# Patient Record
Sex: Female | Born: 1955 | Race: White | Hispanic: No | Marital: Single | Smoking: Former smoker
Health system: Southern US, Community
[De-identification: ages and names within clinical notes are randomized; demographics above are authoritative.]

## PROBLEM LIST (undated history)

## (undated) DIAGNOSIS — M797 Fibromyalgia: Secondary | ICD-10-CM

## (undated) DIAGNOSIS — C9100 Acute lymphoblastic leukemia not having achieved remission: Secondary | ICD-10-CM

## (undated) DIAGNOSIS — K900 Celiac disease: Secondary | ICD-10-CM

## (undated) DIAGNOSIS — C50212 Malignant neoplasm of upper-inner quadrant of left female breast: Secondary | ICD-10-CM

## (undated) DIAGNOSIS — K589 Irritable bowel syndrome without diarrhea: Secondary | ICD-10-CM

## (undated) DIAGNOSIS — Z923 Personal history of irradiation: Secondary | ICD-10-CM

## (undated) HISTORY — DX: Malignant neoplasm of upper-inner quadrant of left female breast: C50.212

## (undated) HISTORY — DX: Irritable bowel syndrome, unspecified: K58.9

## (undated) HISTORY — PX: BREAST BIOPSY: SHX20

## (undated) HISTORY — PX: BREAST LUMPECTOMY: SHX2

## (undated) HISTORY — DX: Fibromyalgia: M79.7

## (undated) HISTORY — DX: Acute lymphoblastic leukemia not having achieved remission: C91.00

## (undated) HISTORY — PX: ABDOMINAL HYSTERECTOMY: SHX81

## (undated) HISTORY — PX: PARTIAL HYSTERECTOMY: SHX80

---

## 1980-11-03 DIAGNOSIS — C9100 Acute lymphoblastic leukemia not having achieved remission: Secondary | ICD-10-CM

## 1980-11-03 HISTORY — PX: BRAIN SURGERY: SHX531

## 1980-11-03 HISTORY — DX: Acute lymphoblastic leukemia not having achieved remission: C91.00

## 2015-07-16 ENCOUNTER — Encounter: Payer: Self-pay | Admitting: Gastroenterology

## 2015-08-02 ENCOUNTER — Other Ambulatory Visit: Payer: Self-pay | Admitting: Gastroenterology

## 2015-08-02 DIAGNOSIS — R1032 Left lower quadrant pain: Secondary | ICD-10-CM

## 2015-08-10 ENCOUNTER — Ambulatory Visit
Admission: RE | Admit: 2015-08-10 | Discharge: 2015-08-10 | Disposition: A | Discharge status: Home / Self Care | Payer: 59 | Source: Ambulatory Visit | Attending: Gastroenterology | Admitting: Gastroenterology

## 2015-08-10 DIAGNOSIS — R1032 Left lower quadrant pain: Secondary | ICD-10-CM

## 2015-08-10 MED ORDER — IOPAMIDOL (ISOVUE-300) INJECTION 61%
100.0000 mL | Freq: Once | INTRAVENOUS | Status: AC | PRN
  Administered 2015-08-10: 100 mL via INTRAVENOUS

## 2015-09-05 ENCOUNTER — Ambulatory Visit: Payer: Self-pay | Admitting: Gastroenterology

## 2016-04-08 ENCOUNTER — Other Ambulatory Visit: Payer: Self-pay | Admitting: Internal Medicine

## 2016-04-08 DIAGNOSIS — Z1231 Encounter for screening mammogram for malignant neoplasm of breast: Secondary | ICD-10-CM

## 2016-04-16 ENCOUNTER — Ambulatory Visit
Admission: RE | Admit: 2016-04-16 | Discharge: 2016-04-16 | Disposition: A | Discharge status: Home / Self Care | Payer: 59 | Source: Ambulatory Visit | Attending: Internal Medicine | Admitting: Internal Medicine

## 2016-04-16 DIAGNOSIS — Z1231 Encounter for screening mammogram for malignant neoplasm of breast: Secondary | ICD-10-CM

## 2016-04-17 ENCOUNTER — Ambulatory Visit
Admission: RE | Admit: 2016-04-17 | Discharge: 2016-04-17 | Disposition: A | Discharge status: Home / Self Care | Payer: 59 | Source: Ambulatory Visit | Attending: Internal Medicine | Admitting: Internal Medicine

## 2016-04-17 ENCOUNTER — Other Ambulatory Visit: Payer: Self-pay | Admitting: Internal Medicine

## 2016-04-17 DIAGNOSIS — R928 Other abnormal and inconclusive findings on diagnostic imaging of breast: Secondary | ICD-10-CM

## 2016-04-17 DIAGNOSIS — N6320 Unspecified lump in the left breast, unspecified quadrant: Secondary | ICD-10-CM

## 2016-04-21 ENCOUNTER — Other Ambulatory Visit: Payer: Self-pay | Admitting: Internal Medicine

## 2016-04-21 ENCOUNTER — Ambulatory Visit
Admission: RE | Admit: 2016-04-21 | Discharge: 2016-04-21 | Disposition: A | Discharge status: Home / Self Care | Payer: 59 | Source: Ambulatory Visit | Attending: Internal Medicine | Admitting: Internal Medicine

## 2016-04-21 DIAGNOSIS — N6320 Unspecified lump in the left breast, unspecified quadrant: Secondary | ICD-10-CM

## 2016-04-22 ENCOUNTER — Telehealth: Payer: Self-pay | Admitting: *Deleted

## 2016-04-22 NOTE — Telephone Encounter (Signed)
Received referral from Dr. Dalbert Batman Scheduled and confirmed new pt appt with Dr. [name] on 04/25/16 at 3:30/4pm Gave contact information for questions or needs.

## 2016-04-24 ENCOUNTER — Other Ambulatory Visit: Payer: Self-pay | Admitting: General Surgery

## 2016-04-24 DIAGNOSIS — C50112 Malignant neoplasm of central portion of left female breast: Secondary | ICD-10-CM

## 2016-04-25 ENCOUNTER — Ambulatory Visit (HOSPITAL_BASED_OUTPATIENT_CLINIC_OR_DEPARTMENT_OTHER): Payer: 59 | Admitting: Oncology

## 2016-04-25 ENCOUNTER — Other Ambulatory Visit: Payer: Self-pay | Admitting: *Deleted

## 2016-04-25 ENCOUNTER — Other Ambulatory Visit (HOSPITAL_BASED_OUTPATIENT_CLINIC_OR_DEPARTMENT_OTHER): Payer: 59

## 2016-04-25 ENCOUNTER — Encounter: Payer: Self-pay | Admitting: *Deleted

## 2016-04-25 VITALS — BP 121/79 | HR 89 | Temp 98.6°F | Resp 18 | Wt 131.6 lb

## 2016-04-25 DIAGNOSIS — Z856 Personal history of leukemia: Secondary | ICD-10-CM

## 2016-04-25 DIAGNOSIS — C50212 Malignant neoplasm of upper-inner quadrant of left female breast: Secondary | ICD-10-CM | POA: Diagnosis not present

## 2016-04-25 DIAGNOSIS — Z170 Estrogen receptor positive status [ER+]: Secondary | ICD-10-CM

## 2016-04-25 LAB — CBC WITH DIFFERENTIAL/PLATELET
BASO%: 0.5 % (ref 0.0–2.0)
BASOS ABS: 0.1 10*3/uL (ref 0.0–0.1)
EOS%: 0.3 % (ref 0.0–7.0)
Eosinophils Absolute: 0 10*3/uL (ref 0.0–0.5)
HEMATOCRIT: 34.3 % — AB (ref 34.8–46.6)
HGB: 11.3 g/dL — ABNORMAL LOW (ref 11.6–15.9)
LYMPH#: 2.3 10*3/uL (ref 0.9–3.3)
LYMPH%: 19.8 % (ref 14.0–49.7)
MCH: 29.7 pg (ref 25.1–34.0)
MCHC: 32.9 g/dL (ref 31.5–36.0)
MCV: 90 fL (ref 79.5–101.0)
MONO#: 0.9 10*3/uL (ref 0.1–0.9)
MONO%: 7.6 % (ref 0.0–14.0)
NEUT#: 8.2 10*3/uL — ABNORMAL HIGH (ref 1.5–6.5)
NEUT%: 71.8 % (ref 38.4–76.8)
Platelets: 361 10*3/uL (ref 145–400)
RBC: 3.81 10*6/uL (ref 3.70–5.45)
RDW: 13.3 % (ref 11.2–14.5)
WBC: 11.4 10*3/uL — ABNORMAL HIGH (ref 3.9–10.3)

## 2016-04-25 LAB — COMPREHENSIVE METABOLIC PANEL
ALT: 35 U/L (ref 0–55)
ANION GAP: 9 meq/L (ref 3–11)
AST: 28 U/L (ref 5–34)
Albumin: 4 g/dL (ref 3.5–5.0)
Alkaline Phosphatase: 54 U/L (ref 40–150)
BUN: 17 mg/dL (ref 7.0–26.0)
CALCIUM: 9.6 mg/dL (ref 8.4–10.4)
CHLORIDE: 100 meq/L (ref 98–109)
CO2: 29 meq/L (ref 22–29)
Creatinine: 0.8 mg/dL (ref 0.6–1.1)
EGFR: 83 mL/min/{1.73_m2} — ABNORMAL LOW (ref >90)
Glucose: 112 mg/dl (ref 70–140)
POTASSIUM: 4.2 meq/L (ref 3.5–5.1)
Sodium: 137 mEq/L (ref 136–145)
Total Bilirubin: <0.3 mg/dL (ref 0.20–1.20)
Total Protein: 7.8 g/dL (ref 6.4–8.3)

## 2016-04-25 NOTE — Progress Notes (Signed)
Loretta Rocha  Telephone:([phone]) [phone] Fax:(336) [phone]     ID: Loretta Rocha DOB: Nov 14, 1955  MR#: [id number]  QQV#:[id number]  No care team member to display PCP: No primary care provider on file. GYN: SU:  OTHER MD:  CHIEF COMPLAINT: Estrogen receptor positive breast cancer   CURRENT TREATMENT: awaiting definitive surgery   BREAST CANCER HISTORY: Loretta Rocha had routine screening mammography with tomography at the [facility] 04/16/2016. This found a possible distortion in the left breast. On 04/17/2016 she underwent diagnostic left mammography with tomography and ultrasonography. The breast density was category C. In the upper inner left breast there was a 0.7 cm obscured mass. A biopsy clip was in the inferior part of the breast. Physical exam found no palpable mass. Ultrasonography confirmed a 0.9 cm irregular echogenic mass at the 12:00 position 5 cm from the nipple, and a second mass at the 10:00 position 4 cm from the nipple measuring 0.7 cm. There was no left axillary adenopathy.  Biopsy of both masses was performed 04/21/2016. The more superior mass, measuring 0.9 cm on ultrasound, was invasive ductal carcinoma, grade 1, estrogen receptor 100% positive, progesterone receptor 100% positive, with strong staining intensity, with an MIB-1 of 15%, and no HER-2 amplification, the signals ratio being 1.79, and the number per cell 1.50 ([id number]). The second mass, at 10:00, showed fibrosis with no evidence of malignancy.  Her subsequent history is as detailed below  INTERVAL HISTORY: Loretta Rocha was evaluated in the breast clinic 04/25/2016 accompanied by her mother and by her brother Loretta Rocha.  REVIEW OF SYSTEMS: There were no specific symptoms leading to the original mammogram, which was routinely scheduled. The patient denies unusual headaches, visual changes, nausea, vomiting, stiff neck, dizziness, or gait imbalance. There has been no cough, phlegm production, or pleurisy, no  chest pain or pressure, and no change in bowel or bladder habits. The patient denies fever, rash, bleeding,  or unexplained weight loss. She describes herself is moderately fatigued and she does have a history of fibromyalgia, with pain "all over" most of the time. She has a history of irritable bowel syndrome. She has just gone off her estrogen replacement and is beginning to experience menopausal symptoms. A detailed review of systems was otherwise entirely negative.  PAST MEDICAL HISTORY: Past Medical History  Diagnosis Date  . Breast cancer of upper-inner quadrant of left female breast ([place])   . Fibromyalgia   . IBS (irritable bowel syndrome)   . 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 Partial hysterectomy      FAMILY HISTORY No family history on file. The patient's father died at age 7 with a history of lung cancer in the setting of tobacco abuse. The patient's mother is 28 years old as of June 2017. The patient has 2 brothers, no sisters. There is a history of prostate cancer on the maternal side. There is no history of breast or ovarian cancer in the family.  GYNECOLOGIC HISTORY:  No LMP recorded. Menarche age 89, first live birth age 70. She is GX P2. The patient underwent simple hysterectomy without salpingo-oophorectomy for endometriosis in 1994. She has been on hormone replacement approximately 6 years, stopping in June 2017.  SOCIAL HISTORY:  Loretta Rocha works as a Psychologist, sport and exercise for [employer]. She is divorced and lives alone with her terrier-yorkie mix and a cat. Daughter Loretta Rocha lives in [place] where she works as a  Education officer, museum. Daughter Loretta Rocha  lives in [place] where she works as a Education administrator, hoping to go to [employer] school. The patient has a step daughter from her earlier marriage Loretta Rocha, who lives in [place]. Loretta Rocha has  one child and one on the way.     ADVANCED DIRECTIVES: Not in place   HEALTH MAINTENANCE: Social History  Substance Use Topics  . Smoking status: Former Smoker    Quit date: 04/27/1993  . Smokeless tobacco: Never Used  . Alcohol Use: 1.2 oz/week    2 Standard drinks or equivalent per week     Colonoscopy: October 2016/  PAP: Status post hysterectomy  Bone density: 2012?  Lipid panel:  Allergies  Allergen Reactions  . Chlorhexidine Gluconate Rash  . Penicillins Rash    Current Outpatient Prescriptions  Medication Sig Dispense Refill  . carisoprodol (SOMA) 350 MG tablet     . DULoxetine (CYMBALTA) 60 MG capsule Take 60 mg by mouth.    . estradiol (ESTRACE) 1 MG tablet     . HYDROcodone-acetaminophen (NORCO/VICODIN) 5-325 MG tablet     . IRON PO     . KRILL OIL PO Take by mouth.    . Red Yeast Rice Extract (RED YEAST RICE PO) Take by mouth.    . TURMERIC CURCUMIN PO Take by mouth.    Marland Kitchen VITAMIN D, CHOLECALCIFEROL, PO Take 5,000 Units by mouth.     No current facility-administered medications for this visit.    OBJECTIVE: Middle-aged white woman in no acute distress Filed Vitals:   04/25/16 1553  BP: 121/79  Pulse: 89  Temp: 98.6 F (37 C)  Resp: 18     There is no height on file to calculate BMI.    ECOG FS:0 - Asymptomatic  Ocular: Sclerae unicteric, pupils equal, round and reactive to light Ear-nose-throat: Oropharynx clear and moist Lymphatic: No cervical or supraclavicular adenopathy Lungs no rales or rhonchi, good excursion bilaterally Heart regular rate and rhythm, no murmur appreciated Abd soft, nontender, positive bowel sounds MSK no focal spinal tenderness, no joint edema Neuro: non-focal, well-oriented, appropriate affect Breasts: The right breast is unremarkable. The left breast is status post recent biopsy. There is a moderate ecchymosis. There are no skin or nipple changes of concern. The left axilla is benign.   LAB RESULTS:  CMP       Component Value Date/Time   NA 137 04/25/2016 1540   K 4.2 04/25/2016 1540   CO2 29 04/25/2016 1540   GLUCOSE 112 04/25/2016 1540   BUN 17.0 04/25/2016 1540   CREATININE 0.8 04/25/2016 1540   CALCIUM 9.6 04/25/2016 1540   PROT 7.8 04/25/2016 1540   ALBUMIN 4.0 04/25/2016 1540   AST 28 04/25/2016 1540   ALT 35 04/25/2016 1540   ALKPHOS 54 04/25/2016 1540   BILITOT <0.30 04/25/2016 1540    INo results found for: SPEP, UPEP  Lab Results  Component Value Date   WBC 11.4* 04/25/2016   NEUTROABS 8.2* 04/25/2016   HGB 11.3* 04/25/2016   HCT 34.3* 04/25/2016   MCV 90.0 04/25/2016   PLT 361 04/25/2016      Chemistry      Component Value Date/Time   NA 137 04/25/2016 1540   K 4.2 04/25/2016 1540   CO2 29 04/25/2016 1540   BUN 17.0 04/25/2016 1540   CREATININE 0.8 04/25/2016 1540      Component Value Date/Time   CALCIUM 9.6 04/25/2016 1540   ALKPHOS 54 04/25/2016 1540  AST 28 04/25/2016 1540   ALT 35 04/25/2016 1540   BILITOT <0.30 04/25/2016 1540       No results found for: LABCA2  No components found for: LABCA125  No results for input(s): INR in the last 168 hours.  Urinalysis No results found for: COLORURINE, APPEARANCEUR, LABSPEC, [place], GLUCOSEU, HGBUR, BILIRUBINUR, KETONESUR, PROTEINUR, UROBILINOGEN, NITRITE, LEUKOCYTESUR      STUDIES: US Breast Ltd Uni Left Inc Axilla  04/17/2016  CLINICAL DATA:  Patient recalled from screening for left breast distortion. EXAM: 2D DIGITAL DIAGNOSTIC LEFT MAMMOGRAM WITH CAD AND ADJUNCT TOMO ULTRASOUND LEFT BREAST COMPARISON:  Previous exam(s). ACR Breast Density Category c: The breast tissue is heterogeneously dense, which may obscure small masses. FINDINGS: Spot compression CC and MLO tomosynthesis images demonstrate persistent architectural distortion within the posterior superior left breast approximate 12 o'clock position. Within the upper inner left breast there is a 7 mm obscured mass. Biopsy marking clip is  demonstrated with inferior left breast posterior depth. No additional concerning masses, calcifications or areas of architectural distortion identified. Mammographic images were processed with CAD. On physical exam, I palpate no discrete mass within the superior and superior inner left breast. Targeted ultrasound is performed, showing a 9 x 5 x 8 mm irregular echogenic mass with associated distortion within the left breast 12 o'clock position 5 cm from the nipple, corresponding with mammographic area of distortion. Additionally within the left breast 10 o'clock position 4 cm from the nipple there is a round 7 x 7 x 8 mm circumscribed hypoechoic mass. No left axillary lymphadenopathy. IMPRESSION: Suspicious hyperechoic irregular left breast mass with associated distortion corresponding with mammographic abnormality. Suspicious left breast mass 10 o'clock position. RECOMMENDATION: Ultrasound-guided core needle biopsy suspicious left breast mass with distortion 12 o'clock position. Ultrasound-guided core needle biopsy suspicious left breast mass 10 o'clock position. I have discussed the findings and recommendations with the patient. Results were also provided in writing at the conclusion of the visit. If applicable, a reminder letter will be sent to the patient regarding the next appointment. BI-RADS CATEGORY  4: Suspicious. Electronically Signed   By: Lovey Newcomer M.D.   On: 04/17/2016 15:36   Mm Diag Breast Tomo Uni Left  04/21/2016  CLINICAL DATA:  Evaluate placement of 2 biopsy clips following ultrasound-guided left breast biopsies. EXAM: DIAGNOSTIC LEFT MAMMOGRAM POST ULTRASOUND BIOPSY COMPARISON:  Previous exam(s). FINDINGS: Mammographic images were obtained following ultrasound guided biopsies of the 9 mm area of distortion at the 12 o'clock position of the left breast and an 8 mm hypoechoic mass at the 10 o'clock position of the left breast. The ribbon shaped clip corresponds to the area of biopsied  distortion at the 12 o'clock position and appears to be in satisfactory position on the CC tomo views. This has a slightly superior position on the ML view when compared to the recent MLO view, but clip placement was felt to be satisfactory sonographically. The coil shaped clip is in satisfactory position corresponding to the 8 mm mass biopsied at the 10 o'clock position. IMPRESSION: Clip placement as described above. The ribbon shaped clip corresponds to the distortion at the 12 o'clock position identified sonographically. The coil shaped clip corresponds to the 8 mm mass at the 10 o'clock position identified sonographically. Final Assessment: Post Procedure Mammograms for Marker Placement Electronically Signed   By: [name] M.D.   On: 04/21/2016 17:26   Mm Diag Breast Tomo Uni Left  04/17/2016  CLINICAL DATA:  Patient recalled from screening  for left breast distortion. EXAM: 2D DIGITAL DIAGNOSTIC LEFT MAMMOGRAM WITH CAD AND ADJUNCT TOMO ULTRASOUND LEFT BREAST COMPARISON:  Previous exam(s). ACR Breast Density Category c: The breast tissue is heterogeneously dense, which may obscure small masses. FINDINGS: Spot compression CC and MLO tomosynthesis images demonstrate persistent architectural distortion within the posterior superior left breast approximate 12 o'clock position. Within the upper inner left breast there is a 7 mm obscured mass. Biopsy marking clip is demonstrated with inferior left breast posterior depth. No additional concerning masses, calcifications or areas of architectural distortion identified. Mammographic images were processed with CAD. On physical exam, I palpate no discrete mass within the superior and superior inner left breast. Targeted ultrasound is performed, showing a 9 x 5 x 8 mm irregular echogenic mass with associated distortion within the left breast 12 o'clock position 5 cm from the nipple, corresponding with mammographic area of distortion. Additionally within the left breast  10 o'clock position 4 cm from the nipple there is a round 7 x 7 x 8 mm circumscribed hypoechoic mass. No left axillary lymphadenopathy. IMPRESSION: Suspicious hyperechoic irregular left breast mass with associated distortion corresponding with mammographic abnormality. Suspicious left breast mass 10 o'clock position. RECOMMENDATION: Ultrasound-guided core needle biopsy suspicious left breast mass with distortion 12 o'clock position. Ultrasound-guided core needle biopsy suspicious left breast mass 10 o'clock position. I have discussed the findings and recommendations with the patient. Results were also provided in writing at the conclusion of the visit. If applicable, a reminder letter will be sent to the patient regarding the next appointment. BI-RADS CATEGORY  4: Suspicious. Electronically Signed   By: Lovey Newcomer M.D.   On: 04/17/2016 15:36   Mm Screening Breast Tomo Bilateral  04/16/2016  CLINICAL DATA:  Screening. EXAM: 2D DIGITAL SCREENING BILATERAL MAMMOGRAM WITH CAD AND ADJUNCT TOMO COMPARISON:  Previous exam(s). ACR Breast Density Category c: The breast tissue is heterogeneously dense, which may obscure small masses. FINDINGS: In the left breast, possible distortion warrants further evaluation. In the right breast, no findings suspicious for malignancy. Images were processed with CAD. IMPRESSION: Further evaluation is suggested for possible distortion in the left breast. RECOMMENDATION: Diagnostic mammogram and possibly ultrasound of the left breast. (Code:[id number]) The patient will be contacted regarding the findings, and additional imaging will be scheduled. BI-RADS CATEGORY  0: Incomplete. Need additional imaging evaluation and/or prior mammograms for comparison. Electronically Signed   By: Claudie Revering M.D.   On: 04/16/2016 13:37   [place] Lt Breast Bx W Loc Dev 1st Lesion Img Bx Spec US Guide  04/23/2016  ADDENDUM REPORT: 04/23/2016 08:42 ADDENDUM: Pathology revealed grade I invasive ductal carcinoma  in the left breast at 12:00. The left breast at 10:00 showed fibrocystic changes with dense stromal fibrosis. This was found to be concordant by Dr. Hassan Rowan. Pathology results were discussed with the patient by telephone. The patient reported doing well after the biopsy with tenderness at the sites. Post biopsy instructions and care were reviewed and questions were answered. The patient was encouraged to call [facility] for any additional concerns. Surgical consultation has been arranged with Dr. Fanny Skates at [facility] on April 24, 2016. Pathology results reported by Susa Raring RN, BSN on 04/23/2016. Electronically Signed   By: [name] M.D.   On: 04/23/2016 08:42  04/23/2016  CLINICAL DATA:  60 year old female with 9 mm distortion within the upper left left breast EXAM: ULTRASOUND GUIDED LEFT BREAST CORE NEEDLE BIOPSY COMPARISON:  Previous  exam(s). FINDINGS: I met with the patient and we discussed the procedure of ultrasound-guided biopsy, including benefits and alternatives. We discussed the high likelihood of a successful procedure. We discussed the risks of the procedure, including infection, bleeding, tissue injury, clip migration, and inadequate sampling. Informed written consent was given. The usual time-out protocol was performed immediately prior to the procedure. Using sterile technique and 1% Lidocaine as local anesthetic, under direct ultrasound visualization, a 12 gauge spring-loaded device was used to perform biopsy of 9 mm air distortion using a medial approach. At the conclusion of the procedure a ribbon shaped tissue marker clip was deployed into the biopsy cavity. Follow up 2 view mammogram was performed and dictated separately. IMPRESSION: Ultrasound guided biopsy of upper left breast distortion. No apparent complications. Pathology will be followed. Electronically Signed: By: [name] M.D. On: 04/21/2016 17:27   [place] Lt Breast Bx  W Loc Dev Ea Add Lesion Img Bx Spec US Guide  04/23/2016  ADDENDUM REPORT: 04/23/2016 08:43 ADDENDUM: Pathology revealed grade I invasive ductal carcinoma in the left breast at 12:00. The left breast at 10:00 showed fibrocystic changes with dense stromal fibrosis. This was found to be concordant by Dr. Hassan Rowan. Pathology results were discussed with the patient by telephone. The patient reported doing well after the biopsy with tenderness at the sites. Post biopsy instructions and care were reviewed and questions were answered. The patient was encouraged to call [facility] for any additional concerns. Surgical consultation has been arranged with Dr. Fanny Skates at [facility] on April 24, 2016. Pathology results reported by Susa Raring RN, BSN on 04/23/2016. Electronically Signed   By: [name] M.D.   On: 04/23/2016 08:43  04/23/2016  CLINICAL DATA:  60 year old female for tissue sampling of 8 mm mass in the upper inner upper left breast. EXAM: ULTRASOUND GUIDED LEFT BREAST CORE NEEDLE BIOPSY COMPARISON:  Previous exam(s). FINDINGS: I met with the patient and we discussed the procedure of ultrasound-guided biopsy, including benefits and alternatives. We discussed the high likelihood of a successful procedure. We discussed the risks of the procedure, including infection, bleeding, tissue injury, clip migration, and inadequate sampling. Informed written consent was given. The usual time-out protocol was performed immediately prior to the procedure. Using sterile technique and 1% Lidocaine as local anesthetic, under direct ultrasound visualization, a 12 gauge spring-loaded device was used to perform biopsy of the 8 mm hypoechoic mass at the 10 o'clock position of the left breast using a medial approach. At the conclusion of the procedure a coil shaped tissue marker clip was deployed into the biopsy cavity. Follow up 2 view mammogram was performed and dictated  separately. IMPRESSION: Ultrasound guided biopsy of upper inner left breast mass. No apparent complications. Pathology will be followed. Electronically Signed: By: [name] M.D. On: 04/21/2016 17:29    ELIGIBLE FOR AVAILABLE RESEARCH PROTOCOL: no  ASSESSMENT: 60 y.o. [place] woman status post left breast upper inner quadrant biopsy 04/21/2016 for a clinical T1b N0, stage IA invasive ductal carcinoma, strongly estrogen and progesterone receptor positive, HER-2 nonamplified, with an MIB-1 of 15%.   (1) breast conserving surgery with sentinel lymph node sampling pending  (2) Oncotype DX to be obtained from the final surgical samples  (3) adjuvant radiation to follow as appropriate  (4) anti-estrogens to follow at the completion of local treatment   PLAN: We spent the better part of today's hour-long appointment discussing the biology of breast cancer in  general, and the specifics of the patient's tumor in particular. Bailey Mech understands that her breast cancer appears to be early-stage an estrogen receptor positive, and in fact types out phenotypically as a luminal A. This are ideal breast cancer generally does well long-term and generally gets very little benefit from chemotherapy.  We discussed the difference between local and systemic treatment for breast cancer. In terms of local treatment there is no benefit for mastectomy as compared to lumpectomy and radiation. Bailey Mech is a very good candidate for lumpectomy and the plan at present is for lumpectomy with sentinel lymph node sampling, already scheduled for next week.  We are going to obtain an Oncotype DX to help [place] with the chemotherapy decision. My expectation however is that this will come back "low risk" and that therefore Bailey Mech will go directly from surgery to radiation.  We did discuss the mechanics of radiation and the possible toxicities, side effects and complications of the various anti-estrogens. As far as systemic therapy is  concerned, anti-estrogens will be the most effective single treatment for her. She is not a candidate for anti-HER-2 treatment.  I have made a return appointment for Robyn in approximately 3 weeks to discuss the Oncotype results, but if they are low risk, we well avoid that appointment, she will move directly to radiation, and she will return to see me at the completion of radiation. At that time we will start anti-estrogens.  In the meantime she is going off estrogens, for a short taper. She is aware of the common symptoms of menopause including hot flashes, vaginal dryness, insomnia, weight gain, mood changes, and bone thinning. We will be discussing these in subsequent visits but if she has significant problems before then she will let us know.    The patient has a good understanding of the overall plan. She agrees with it. She knows the goal of treatment in her case is cure. She will call with any problems that may develop before her next visit here.  Chauncey Cruel, MD   04/27/2016 8:03 AM Medical Oncology and Hematology [facility] [address] [place],  [address] Tel. [phone]    Fax. [phone]

## 2016-04-27 ENCOUNTER — Other Ambulatory Visit: Payer: Self-pay | Admitting: Oncology

## 2016-04-27 ENCOUNTER — Encounter: Payer: Self-pay | Admitting: Oncology

## 2016-04-27 DIAGNOSIS — C50212 Malignant neoplasm of upper-inner quadrant of left female breast: Secondary | ICD-10-CM

## 2016-04-27 DIAGNOSIS — Z856 Personal history of leukemia: Secondary | ICD-10-CM | POA: Insufficient documentation

## 2016-04-28 ENCOUNTER — Telehealth: Payer: Self-pay | Admitting: Oncology

## 2016-04-28 ENCOUNTER — Other Ambulatory Visit: Payer: Self-pay | Admitting: General Surgery

## 2016-04-28 ENCOUNTER — Encounter (HOSPITAL_BASED_OUTPATIENT_CLINIC_OR_DEPARTMENT_OTHER): Payer: Self-pay | Admitting: *Deleted

## 2016-04-28 DIAGNOSIS — C50112 Malignant neoplasm of central portion of left female breast: Secondary | ICD-10-CM

## 2016-04-28 NOTE — Telephone Encounter (Signed)
lvm for pt regarding to 7.14 appt

## 2016-04-30 ENCOUNTER — Ambulatory Visit
Admission: RE | Admit: 2016-04-30 | Discharge: 2016-04-30 | Disposition: A | Discharge status: Home / Self Care | Payer: 59 | Source: Ambulatory Visit | Attending: General Surgery | Admitting: General Surgery

## 2016-04-30 DIAGNOSIS — C50112 Malignant neoplasm of central portion of left female breast: Secondary | ICD-10-CM

## 2016-04-30 NOTE — Progress Notes (Signed)
Boost drink given with instructions to complete by 0930, verbalized understanding.

## 2016-04-30 NOTE — H&P (Signed)
Loretta Rocha Location: [facility] Patient #: [id number] DOB: [birth year] Single / Language: Loretta Rocha / Race: White Female        History of Present Illness  The patient is a 60 year old female who presents with breast cancer. 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 She was referred to me by Dr. 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 Dr. [name] is her PCP. She is scheduled to see Dr. [name] tomorrow for a preoperative medical oncology consultation.  The only prior breast problems she had was a left breast image guided biopsy 7 years ago in the inferior pole of the left breast which was benign. She gets annual mammograms. Her recent mammogram showed 2 densities. At the 12 o'clock position there was a 9 mm mass 5 cm from the nipple at the 10 o'clock position there was an 8 mm mass 4 cm from the nipple. Both of these areas were biopsied. The mass at 10:00 was benign fibrocystic change. The mass at 12:00 was invasive ductal carcinoma, grade 1. Breast diagnostic profile pending. Her breasts are category C density  Comorbidities include treatment for acute lymphoblastic leukemia at age 41 at [facility]. Chemotherapy for 2 years. Remains in remission. Biopsy-proven celiac disease. Fibromyalgia. She's had a vaginal hysterectomy without BSO for endometriosis. She takes Soma, tramadol, Cymbalta and hormone replacement therapy. She is going to wean off the estrogen hormone replacement therapy over the next 2 weeks. I told her that was essential.  Family history is negative for breast, ovarian, colon, or pancreatic cancer. Father had diabetes and had alcohol dependence problems. Mother is living and is here with her throughout the encounter. Mother has celiac disease and irritable bowel  syndrome.  Social history reveals she is divorced. Works in our office. Has 2 biologic children and 1 stepdaughter. Quit smoking 20 years ago. Has an alcoholic beverage about every 3 days.  We talked about her breast cancer. Clinically this is stage I. She prefers lumpectomy over mastectomy and I think she is an excellent candidate for that. I told her there was no survival advantage for mastectomy. I don't see an obvious reason to do preoperative breast MRI.  She will be scheduled for left breast lumpectomy with radioactive seed localization, left axillary sentinel node biopsy. I discussed the indications, details, techniques, and numerous risk of the surgery with her. She is aware of the risk of bleeding, infection, reoperation for positive margins or positive nodes, cosmetic deformity, nerve damage with chronic pain, cardiac pulmonary and thromboembolic problems. She understands all these issues. All of her questions are answered. She agrees with this plan.  She will see Dr. [name] tomorrow She will wean off her hormone replacement therapy We will try to get this done in the next 2 weeks if if the scheduling will allow   Other Problems  Back Pain Cancer Hypercholesterolemia  Past Surgical History  Breast Biopsy Left. multiple Hysterectomy (not due to cancer) - Partial Oral Surgery  Diagnostic Studies History  Colonoscopy within last year Mammogram within last year Pap Smear 1-5 years ago  Allergies  Penicillins Chlorhexidine *PHARMACEUTICAL ADJUVANTS*  Medication History  Hyoscyamine Sulfate (0.125MG Tablet Disperse, Oral) Active. Estradiol (1MG Tablet, Oral) Active. Hydrocodone-Acetaminophen (5-325MG Tablet, Oral) Active. Soma (350MG Tablet, Oral) Active. Cymbalta (60MG Capsule DR Part, Oral) Active. Imodium (2MG Capsule, Oral) Active. Multiple  Vitamin (Oral) Active. Krill Oil (1000MG Capsule, Oral) Active. Vitamin D (1000UNIT Tablet,  Oral) Active. Benadryl Allergy (25MG Capsule, Oral) Active. Medications Reconciled   Alcohol use Occasional alcohol use. Caffeine use Carbonated beverages, Coffee, Tea. No drug use Tobacco use Former smoker.  Family History  Alcohol Abuse Brother, Father. Depression Brother. Ischemic Bowel Disease Daughter, Mother.  Pregnancy / Birth History  Age at menarche 53 years. Age of menopause 23-50 Contraceptive History Oral contraceptives. Gravida 3 Length (months) of breastfeeding 3-6 Maternal age 23-30 Para 2   ROS: General Present- Fatigue. Not Present- Appetite Loss, Chills, Fever, Night Sweats, Weight Gain and Weight Loss. Skin Not Present- Change in Wart/Mole, Dryness, Hives, Jaundice, New Lesions, Non-Healing Wounds, Rash and Ulcer. HEENT Present- Wears glasses/contact lenses. Not Present- Earache, Hearing Loss, Hoarseness, Nose Bleed, Oral Ulcers, Ringing in the Ears, Seasonal Allergies, Sinus Pain, Sore Throat, Visual Disturbances and Yellow Eyes. Respiratory Not Present- Bloody sputum, Chronic Cough, Difficulty Breathing, Snoring and Wheezing. Breast Present- Breast Mass. Not Present- Breast Pain, Nipple Discharge and Skin Changes. Cardiovascular Not Present- Chest Pain, Difficulty Breathing Lying Down, Leg Cramps, Palpitations, Rapid Heart Rate, Shortness of Breath and Swelling of Extremities. Gastrointestinal Present- Bloating, Constipation and Hemorrhoids. Not Present- Abdominal Pain, Bloody Stool, Change in Bowel Habits, Chronic diarrhea, Difficulty Swallowing, Excessive gas, Gets full quickly at meals, Indigestion, Nausea, Rectal Pain and Vomiting. Female Genitourinary Not Present- Frequency, Nocturia, Painful Urination, Pelvic Pain and Urgency. Musculoskeletal Present- Joint Pain, Joint Stiffness and Muscle Pain. Not Present- Back Pain, Muscle Weakness and Swelling of Extremities. Neurological Present- Decreased Memory, Headaches and Numbness. Not Present-  Fainting, Seizures, Tingling, Tremor, Trouble walking and Weakness. Psychiatric Not Present- Anxiety, Bipolar, Change in Sleep Pattern, Depression, Fearful and Frequent crying. Endocrine Not Present- Cold Intolerance, Excessive Hunger, Hair Changes, Heat Intolerance, Hot flashes and New Diabetes. Hematology Not Present- Blood Thinners, Easy Bruising, Excessive bleeding, Gland problems, HIV and Persistent Infections.  Vitals  Weight: 133 lb Height: 63in Body Surface Area: 1.63 m Body Mass Index: 23.56 kg/m  Temp.: 97.92F(Oral)  Pulse: 98 (Regular)  BP: 140/78 (Sitting, Left Arm, Standard)   Physical Exam  General Mental Status-Alert. General Appearance-Consistent with stated age. Hydration-Well hydrated. Voice-Normal.  Head and Neck Head-normocephalic, atraumatic with no lesions or palpable masses. Trachea-midline. Thyroid Gland Characteristics - normal size and consistency.  Eye Eyeball - Bilateral-Extraocular movements intact. Sclera/Conjunctiva - Bilateral-No scleral icterus.  Chest and Lung Exam Chest and lung exam reveals -quiet, even and easy respiratory effort with no use of accessory muscles and on auscultation, normal breath sounds, no adventitious sounds and normal vocal resonance. Inspection Chest Wall - Normal. Back - normal.  Breast Note: Medium sized. Some ecchymoses and maybe a tiny hematoma at 12:00 but no obvious discrete mass. No other skin changes or masses in either breast. No axillary adenopathy.   Cardiovascular Cardiovascular examination reveals -normal heart sounds, regular rate and rhythm with no murmurs and normal pedal pulses bilaterally.  Abdomen Inspection Inspection of the abdomen reveals - No Hernias. Skin - Scar - no surgical scars. Palpation/Percussion Palpation and Percussion of the abdomen reveal - Soft, Non Tender, No Rebound tenderness, No Rigidity (guarding) and No  hepatosplenomegaly. Auscultation Auscultation of the abdomen reveals - Bowel sounds normal.  Neurologic Neurologic evaluation reveals -alert and oriented x 3 with no impairment of recent or remote memory. Mental Status-Normal.  Musculoskeletal Normal Exam - Left-Upper Extremity Strength Normal and Lower Extremity Strength Normal. Normal Exam - Right-Upper Extremity Strength Normal and Lower Extremity Strength  Normal.  Lymphatic Head & Neck  General Head & Neck Lymphatics: Bilateral - Description - Normal. Axillary  General Axillary Region: Bilateral - Description - Normal. Tenderness - Non Tender. Femoral & Inguinal  Generalized Femoral & Inguinal Lymphatics: Bilateral - Description - Normal. Tenderness - Non Tender.    Assessment & Plan  CANCER OF CENTRAL PORTION OF LEFT BREAST (C50.112) .  Your recent imaging studies and biopsy showed a 9 mm invasive ductal carcinoma of the left breast at the 12 o'clock position, 5 cm from the nipple. Hormone receptors and HER-2 analysis is pending Clinically this is a stage I cancer  We have discussed the difference between lumpectomy and mastectomy with or without reconstruction. It is your preference to proceed with lumpectomy, and I think that you are an excellent candidate for that  Keep your appointment with Dr. [name] tomorrow to make sure he doesn't have any other recommendations Decisions about radiation therapy and other treatments will be made after surgery  You will be scheduled for left breast lumpectomy with radioactive seed localization, left axillary sentinel node biopsy. We've discussed the indications, techniques, and numerous risks of this surgery.  FIBROMYALGIA (M79.7) CELIAC DISEASE (K90.0) HISTORY OF ACUTE LYMPHOBLASTIC LEUKEMIA (ALL) (Z85.6) Impression: Diagnosis H 22. Chemotherapy for 2 years. Remains in remission. HISTORY OF VAGINAL HYSTERECTOMY (Z90.710) Impression: Ovaries were not  removed    Edsel Petrin. Dalbert Batman, M.D., [facility] General and Minimally invasive Surgery Breast and Colorectal Surgery Office:   [phone] Pager:   [phone]

## 2016-05-01 ENCOUNTER — Ambulatory Visit (HOSPITAL_BASED_OUTPATIENT_CLINIC_OR_DEPARTMENT_OTHER)
Admission: RE | Admit: 2016-05-01 | Discharge: 2016-05-01 | Disposition: A | Discharge status: Home / Self Care | Payer: 59 | Source: Ambulatory Visit | Attending: General Surgery | Admitting: General Surgery

## 2016-05-01 ENCOUNTER — Ambulatory Visit (HOSPITAL_COMMUNITY)
Admission: RE | Admit: 2016-05-01 | Discharge: 2016-05-01 | Disposition: A | Discharge status: Home / Self Care | Payer: 59 | Source: Ambulatory Visit | Attending: General Surgery | Admitting: General Surgery

## 2016-05-01 ENCOUNTER — Ambulatory Visit (HOSPITAL_BASED_OUTPATIENT_CLINIC_OR_DEPARTMENT_OTHER): Payer: 59 | Admitting: Anesthesiology

## 2016-05-01 ENCOUNTER — Encounter (HOSPITAL_BASED_OUTPATIENT_CLINIC_OR_DEPARTMENT_OTHER): Payer: Self-pay | Admitting: Anesthesiology

## 2016-05-01 ENCOUNTER — Ambulatory Visit
Admission: RE | Admit: 2016-05-01 | Discharge: 2016-05-01 | Disposition: A | Discharge status: Home / Self Care | Payer: 59 | Source: Ambulatory Visit | Attending: General Surgery | Admitting: General Surgery

## 2016-05-01 ENCOUNTER — Encounter (HOSPITAL_BASED_OUTPATIENT_CLINIC_OR_DEPARTMENT_OTHER)
Admission: RE | Disposition: A | Discharge status: Home / Self Care | Payer: Self-pay | Source: Ambulatory Visit | Attending: General Surgery

## 2016-05-01 DIAGNOSIS — Z87891 Personal history of nicotine dependence: Secondary | ICD-10-CM | POA: Diagnosis not present

## 2016-05-01 DIAGNOSIS — C50212 Malignant neoplasm of upper-inner quadrant of left female breast: Secondary | ICD-10-CM | POA: Diagnosis present

## 2016-05-01 DIAGNOSIS — Z9221 Personal history of antineoplastic chemotherapy: Secondary | ICD-10-CM | POA: Diagnosis not present

## 2016-05-01 DIAGNOSIS — M797 Fibromyalgia: Secondary | ICD-10-CM | POA: Diagnosis not present

## 2016-05-01 DIAGNOSIS — Z170 Estrogen receptor positive status [ER+]: Secondary | ICD-10-CM | POA: Diagnosis present

## 2016-05-01 DIAGNOSIS — C50112 Malignant neoplasm of central portion of left female breast: Secondary | ICD-10-CM

## 2016-05-01 DIAGNOSIS — Z79890 Hormone replacement therapy (postmenopausal): Secondary | ICD-10-CM | POA: Diagnosis not present

## 2016-05-01 DIAGNOSIS — D0512 Intraductal carcinoma in situ of left breast: Secondary | ICD-10-CM | POA: Insufficient documentation

## 2016-05-01 DIAGNOSIS — K900 Celiac disease: Secondary | ICD-10-CM | POA: Insufficient documentation

## 2016-05-01 HISTORY — PX: RADIOACTIVE SEED GUIDED PARTIAL MASTECTOMY WITH AXILLARY SENTINEL LYMPH NODE BIOPSY: SHX6520

## 2016-05-01 SURGERY — RADIOACTIVE SEED GUIDED PARTIAL MASTECTOMY WITH AXILLARY SENTINEL LYMPH NODE BIOPSY
Anesthesia: General | Site: Breast | Laterality: Left

## 2016-05-01 MED ORDER — METHYLENE BLUE 0.5 % INJ SOLN
INTRAVENOUS | Status: AC
  Filled 2016-05-01: qty 20

## 2016-05-01 MED ORDER — FENTANYL CITRATE (PF) 100 MCG/2ML IJ SOLN
INTRAMUSCULAR | Status: AC
  Filled 2016-05-01: qty 2

## 2016-05-01 MED ORDER — LACTATED RINGERS IV SOLN
INTRAVENOUS | Status: DC
  Administered 2016-05-01: 12:00:00 via INTRAVENOUS

## 2016-05-01 MED ORDER — ONDANSETRON HCL 4 MG/2ML IJ SOLN
INTRAMUSCULAR | Status: AC
  Filled 2016-05-01: qty 2

## 2016-05-01 MED ORDER — GABAPENTIN 300 MG PO CAPS
ORAL_CAPSULE | ORAL | Status: AC
  Filled 2016-05-01: qty 1

## 2016-05-01 MED ORDER — DEXAMETHASONE SODIUM PHOSPHATE 4 MG/ML IJ SOLN
INTRAMUSCULAR | Status: DC | PRN
  Administered 2016-05-01: 10 mg via INTRAVENOUS

## 2016-05-01 MED ORDER — PROMETHAZINE HCL 25 MG/ML IJ SOLN: 6.2500 mg | INTRAMUSCULAR | Status: DC | PRN

## 2016-05-01 MED ORDER — CEFAZOLIN SODIUM-DEXTROSE 2-4 GM/100ML-% IV SOLN
INTRAVENOUS | Status: AC
  Filled 2016-05-01: qty 100

## 2016-05-01 MED ORDER — SODIUM CHLORIDE 0.9 % IJ SOLN
INTRAVENOUS | Status: DC | PRN
  Administered 2016-05-01: 5 mL via INTRAMUSCULAR

## 2016-05-01 MED ORDER — FENTANYL CITRATE (PF) 100 MCG/2ML IJ SOLN
25.0000 ug | INTRAMUSCULAR | Status: DC | PRN
  Administered 2016-05-01 (×2): 50 ug via INTRAVENOUS

## 2016-05-01 MED ORDER — MIDAZOLAM HCL 2 MG/2ML IJ SOLN
INTRAMUSCULAR | Status: AC
  Filled 2016-05-01: qty 2

## 2016-05-01 MED ORDER — TECHNETIUM TC 99M SULFUR COLLOID FILTERED
1.0000 | Freq: Once | INTRAVENOUS | Status: AC | PRN
  Administered 2016-05-01: 1 via INTRADERMAL

## 2016-05-01 MED ORDER — DEXAMETHASONE SODIUM PHOSPHATE 10 MG/ML IJ SOLN
INTRAMUSCULAR | Status: AC
  Filled 2016-05-01: qty 1

## 2016-05-01 MED ORDER — BUPIVACAINE HCL 0.5 % IJ SOLN
INTRAMUSCULAR | Status: DC | PRN
  Administered 2016-05-01: 11 mL

## 2016-05-01 MED ORDER — SODIUM BICARBONATE 4 % IV SOLN
INTRAVENOUS | Status: AC
  Filled 2016-05-01: qty 10

## 2016-05-01 MED ORDER — MIDAZOLAM HCL 2 MG/2ML IJ SOLN
1.0000 mg | INTRAMUSCULAR | Status: DC | PRN
  Administered 2016-05-01 (×3): 1 mg via INTRAVENOUS

## 2016-05-01 MED ORDER — CELECOXIB 400 MG PO CAPS
400.0000 mg | ORAL_CAPSULE | ORAL | Status: AC
  Administered 2016-05-01: 400 mg via ORAL

## 2016-05-01 MED ORDER — HYDROCODONE-ACETAMINOPHEN 5-325 MG PO TABS
ORAL_TABLET | ORAL | Status: AC
  Filled 2016-05-01: qty 1

## 2016-05-01 MED ORDER — ACETAMINOPHEN 500 MG PO TABS
1000.0000 mg | ORAL_TABLET | ORAL | Status: AC
  Administered 2016-05-01: 1000 mg via ORAL

## 2016-05-01 MED ORDER — PROPOFOL 10 MG/ML IV BOLUS
INTRAVENOUS | Status: DC | PRN
  Administered 2016-05-01: 150 mg via INTRAVENOUS

## 2016-05-01 MED ORDER — FENTANYL CITRATE (PF) 100 MCG/2ML IJ SOLN
50.0000 ug | INTRAMUSCULAR | Status: AC | PRN
  Administered 2016-05-01 (×3): 50 ug via INTRAVENOUS

## 2016-05-01 MED ORDER — HYDROCODONE-ACETAMINOPHEN 5-325 MG PO TABS
1.0000 | ORAL_TABLET | Freq: Four times a day (QID) | ORAL | Status: DC | PRN
  Administered 2016-05-01: 1 via ORAL

## 2016-05-01 MED ORDER — BUPIVACAINE-EPINEPHRINE (PF) 0.5% -1:200000 IJ SOLN
INTRAMUSCULAR | Status: DC | PRN
  Administered 2016-05-01: 30 mL

## 2016-05-01 MED ORDER — BUPIVACAINE-EPINEPHRINE (PF) 0.5% -1:200000 IJ SOLN
INTRAMUSCULAR | Status: AC
  Filled 2016-05-01: qty 60

## 2016-05-01 MED ORDER — GABAPENTIN 300 MG PO CAPS
300.0000 mg | ORAL_CAPSULE | ORAL | Status: AC
  Administered 2016-05-01: 300 mg via ORAL

## 2016-05-01 MED ORDER — GLYCOPYRROLATE 0.2 MG/ML IJ SOLN: 0.2000 mg | Freq: Once | INTRAMUSCULAR | Status: DC | PRN

## 2016-05-01 MED ORDER — HYDROCODONE-ACETAMINOPHEN 5-325 MG PO TABS: 1.0000 | ORAL_TABLET | Freq: Four times a day (QID) | ORAL | Status: DC | PRN

## 2016-05-01 MED ORDER — LIDOCAINE 2% (20 MG/ML) 5 ML SYRINGE
INTRAMUSCULAR | Status: DC | PRN
  Administered 2016-05-01: 50 mg via INTRAVENOUS

## 2016-05-01 MED ORDER — SCOPOLAMINE 1 MG/3DAYS TD PT72: 1.0000 | MEDICATED_PATCH | Freq: Once | TRANSDERMAL | Status: DC | PRN

## 2016-05-01 MED ORDER — SODIUM CHLORIDE 0.9 % IJ SOLN
INTRAMUSCULAR | Status: AC
  Filled 2016-05-01: qty 20

## 2016-05-01 MED ORDER — CELECOXIB 200 MG PO CAPS
ORAL_CAPSULE | ORAL | Status: AC
  Filled 2016-05-01: qty 2

## 2016-05-01 MED ORDER — ACETAMINOPHEN 500 MG PO TABS
ORAL_TABLET | ORAL | Status: AC
  Filled 2016-05-01: qty 2

## 2016-05-01 MED ORDER — ONDANSETRON HCL 4 MG/2ML IJ SOLN
INTRAMUSCULAR | Status: DC | PRN
  Administered 2016-05-01: 4 mg via INTRAVENOUS

## 2016-05-01 MED ORDER — CEFAZOLIN SODIUM-DEXTROSE 2-4 GM/100ML-% IV SOLN
2.0000 g | INTRAVENOUS | Status: AC
  Administered 2016-05-01: 2 g via INTRAVENOUS

## 2016-05-01 SURGICAL SUPPLY — 64 items
APPLIER CLIP 9.375 MED OPEN (MISCELLANEOUS) ×3
BENZOIN TINCTURE PRP APPL 2/3 (GAUZE/BANDAGES/DRESSINGS) IMPLANT
BINDER BREAST LRG (GAUZE/BANDAGES/DRESSINGS) ×3 IMPLANT
BINDER BREAST MEDIUM (GAUZE/BANDAGES/DRESSINGS) IMPLANT
BINDER BREAST XLRG (GAUZE/BANDAGES/DRESSINGS) IMPLANT
BINDER BREAST XXLRG (GAUZE/BANDAGES/DRESSINGS) IMPLANT
BLADE HEX COATED 2.75 (ELECTRODE) ×3 IMPLANT
BLADE SURG 10 STRL SS (BLADE) ×3 IMPLANT
BLADE SURG 15 STRL LF DISP TIS (BLADE) ×1 IMPLANT
BLADE SURG 15 STRL SS (BLADE) ×2
CANISTER SUC SOCK COL 7IN (MISCELLANEOUS) IMPLANT
CANISTER SUCT 1200ML W/VALVE (MISCELLANEOUS) ×3 IMPLANT
CHLORAPREP W/TINT 26ML (MISCELLANEOUS) IMPLANT
CLIP APPLIE 9.375 MED OPEN (MISCELLANEOUS) ×1 IMPLANT
CLOSURE WOUND 1/2 X4 (GAUZE/BANDAGES/DRESSINGS)
COVER BACK TABLE 60X90IN (DRAPES) ×3 IMPLANT
COVER MAYO STAND STRL (DRAPES) ×3 IMPLANT
COVER PROBE W GEL 5X96 (DRAPES) ×3 IMPLANT
DECANTER SPIKE VIAL GLASS SM (MISCELLANEOUS) ×3 IMPLANT
DERMABOND ADVANCED (GAUZE/BANDAGES/DRESSINGS) ×2
DERMABOND ADVANCED .7 DNX12 (GAUZE/BANDAGES/DRESSINGS) ×1 IMPLANT
DEVICE DUBIN W/COMP PLATE 8390 (MISCELLANEOUS) ×3 IMPLANT
DRAPE LAPAROSCOPIC ABDOMINAL (DRAPES) ×3 IMPLANT
DRAPE UTILITY XL STRL (DRAPES) IMPLANT
DRSG PAD ABDOMINAL 8X10 ST (GAUZE/BANDAGES/DRESSINGS) IMPLANT
ELECT REM PT RETURN 9FT ADLT (ELECTROSURGICAL) ×3
ELECTRODE REM PT RTRN 9FT ADLT (ELECTROSURGICAL) ×1 IMPLANT
GAUZE SPONGE 4X4 12PLY STRL (GAUZE/BANDAGES/DRESSINGS) IMPLANT
GLOVE BIOGEL PI IND STRL 7.0 (GLOVE) ×2 IMPLANT
GLOVE BIOGEL PI INDICATOR 7.0 (GLOVE) ×4
GLOVE ECLIPSE 6.5 STRL STRAW (GLOVE) ×3 IMPLANT
GLOVE EUDERMIC 7 POWDERFREE (GLOVE) ×6 IMPLANT
GOWN STRL REUS W/ TWL LRG LVL3 (GOWN DISPOSABLE) ×1 IMPLANT
GOWN STRL REUS W/ TWL XL LVL3 (GOWN DISPOSABLE) ×1 IMPLANT
GOWN STRL REUS W/TWL LRG LVL3 (GOWN DISPOSABLE) ×2
GOWN STRL REUS W/TWL XL LVL3 (GOWN DISPOSABLE) ×2
ILLUMINATOR WAVEGUIDE N/F (MISCELLANEOUS) IMPLANT
KIT MARKER MARGIN INK (KITS) ×3 IMPLANT
LIGHT WAVEGUIDE WIDE FLAT (MISCELLANEOUS) IMPLANT
NDL SAFETY ECLIPSE 18X1.5 (NEEDLE) ×1 IMPLANT
NEEDLE HYPO 18GX1.5 SHARP (NEEDLE) ×2
NEEDLE HYPO 25X1 1.5 SAFETY (NEEDLE) ×6 IMPLANT
NS IRRIG 1000ML POUR BTL (IV SOLUTION) ×3 IMPLANT
PACK BASIN DAY SURGERY FS (CUSTOM PROCEDURE TRAY) ×3 IMPLANT
PENCIL BUTTON HOLSTER BLD 10FT (ELECTRODE) ×3 IMPLANT
SHEET MEDIUM DRAPE 40X70 STRL (DRAPES) ×3 IMPLANT
SLEEVE SCD COMPRESS KNEE MED (MISCELLANEOUS) ×3 IMPLANT
SPONGE LAP 18X18 X RAY DECT (DISPOSABLE) IMPLANT
SPONGE LAP 4X18 X RAY DECT (DISPOSABLE) ×3 IMPLANT
STRIP CLOSURE SKIN 1/2X4 (GAUZE/BANDAGES/DRESSINGS) IMPLANT
SUT MNCRL AB 4-0 PS2 18 (SUTURE) ×3 IMPLANT
SUT SILK 2 0 SH (SUTURE) ×3 IMPLANT
SUT VIC AB 2-0 CT1 27 (SUTURE)
SUT VIC AB 2-0 CT1 TAPERPNT 27 (SUTURE) IMPLANT
SUT VIC AB 3-0 SH 27 (SUTURE)
SUT VIC AB 3-0 SH 27X BRD (SUTURE) IMPLANT
SUT VICRYL 3-0 CR8 SH (SUTURE) ×3 IMPLANT
SYRINGE 10CC LL (SYRINGE) ×6 IMPLANT
TOWEL OR 17X24 6PK STRL BLUE (TOWEL DISPOSABLE) ×3 IMPLANT
TOWEL OR NON WOVEN STRL DISP B (DISPOSABLE) ×3 IMPLANT
TRAY DSU PREP LF (CUSTOM PROCEDURE TRAY) ×3 IMPLANT
TUBE CONNECTING 20'X1/4 (TUBING) ×1
TUBE CONNECTING 20X1/4 (TUBING) ×2 IMPLANT
YANKAUER SUCT BULB TIP NO VENT (SUCTIONS) ×3 IMPLANT

## 2016-05-01 NOTE — Interval H&P Note (Signed)
History and Physical Interval Note:  05/01/2016 12:59 PM  Loretta Rocha  has presented today for surgery, with the diagnosis of INVASIVE DUCTAL CARCINOMA LEFT BREAST  The various methods of treatment have been discussed with the patient and family. After consideration of risks, benefits and other options for treatment, the patient has consented to  Procedure(s): RADIOACTIVE SEED GUIDED LEFT BREAST LUMPECTOMY WITH AXILLARY SENTINEL LYMPH NODE BIOPSY (Left) as a surgical intervention .  The patient's history has been reviewed, patient examined, no change in status, stable for surgery.  I have reviewed the patient's chart and labs.  Questions were answered to the patient's satisfaction.     Adin Hector

## 2016-05-01 NOTE — Discharge Instructions (Signed)
[facility] °Office Phone Number [phone] ° °BREAST BIOPSY/ PARTIAL MASTECTOMY: POST OP INSTRUCTIONS ° °Always review your discharge instruction sheet given to you by the facility where your surgery was performed. ° °IF YOU HAVE DISABILITY OR FAMILY LEAVE FORMS, YOU MUST BRING THEM TO THE OFFICE FOR PROCESSING.  DO NOT GIVE THEM TO YOUR DOCTOR. ° °1. A prescription for pain medication may be given to you upon discharge.  Take your pain medication as prescribed, if needed.  If narcotic pain medicine is not needed, then you may take acetaminophen (Tylenol) or ibuprofen (Advil) as needed. °2. Take your usually prescribed medications unless otherwise directed °3. If you need a refill on your pain medication, please contact your pharmacy.  They will contact our office to request authorization.  Prescriptions will not be filled after 5pm or on week-ends. °4. You should eat very light the first 24 hours after surgery, such as soup, crackers, pudding, etc.  Resume your normal diet the day after surgery. °5. Most patients will experience some swelling and bruising in the breast.  Ice packs and a good support bra will help.  Swelling and bruising can take several days to resolve.  °6. It is common to experience some constipation if taking pain medication after surgery.  Increasing fluid intake and taking a stool softener will usually help or prevent this problem from occurring.  A mild laxative (Milk of Magnesia or Miralax) should be taken according to package directions if there are no bowel movements after 48 hours. °7. Unless discharge instructions indicate otherwise, you may remove your bandages 24-48 hours after surgery, and you may shower at that time.  You may have steri-strips (small skin tapes) in place directly over the incision.  These strips should be left on the skin for 7-10 days.  If your surgeon used skin glue on the incision, you may shower in 24 hours.  The glue will flake off over the  next 2-3 weeks.  Any sutures or staples will be removed at the office during your follow-up visit. °8. ACTIVITIES:  You may resume regular daily activities (gradually increasing) beginning the next day.  Wearing a good support bra or sports bra minimizes pain and swelling.  You may have sexual intercourse when it is comfortable. °a. You may drive when you no longer are taking prescription pain medication, you can comfortably wear a seatbelt, and you can safely maneuver your car and apply brakes. °b. RETURN TO WORK:  ______________________________________________________________________________________ °9. You should see your doctor in the office for a follow-up appointment approximately two weeks after your surgery.  Your doctor’s nurse will typically make your follow-up appointment when she calls you with your pathology report.  Expect your pathology report 2-3 business days after your surgery.  You may call to check if you do not hear from us after three days. °10. OTHER INSTRUCTIONS: _______________________________________________________________________________________________ _____________________________________________________________________________________________________________________________________ °_____________________________________________________________________________________________________________________________________ °_____________________________________________________________________________________________________________________________________ ° °WHEN TO CALL YOUR DOCTOR: °1. Fever over 101.0 °2. Nausea and/or vomiting. °3. Extreme swelling or bruising. °4. Continued bleeding from incision. °5. Increased pain, redness, or drainage from the incision. ° °The clinic staff is available to answer your questions during regular business hours.  Please don’t hesitate to call and ask to speak to one of the nurses for clinical concerns.  If you have a medical emergency, go to the nearest  emergency room or call 911.  A surgeon from [facility] is always on call at the hospital. ° °For further questions, please visit [email or web address]  ° ° ° °  Post Anesthesia Home Care Instructions ° °Activity: °Get plenty of rest for the remainder of the day. A responsible adult should stay with you for 24 hours following the procedure.  °For the next 24 hours, DO NOT: °-Drive a car °-Operate machinery °-Drink alcoholic beverages °-Take any medication unless instructed by your physician °-Make any legal decisions or sign important papers. ° °Meals: °Start with liquid foods such as gelatin or soup. Progress to regular foods as tolerated. Avoid greasy, spicy, heavy foods. If nausea and/or vomiting occur, drink only clear liquids until the nausea and/or vomiting subsides. Call your physician if vomiting continues. ° °Special Instructions/Symptoms: °Your throat may feel dry or sore from the anesthesia or the breathing tube placed in your throat during surgery. If this causes discomfort, gargle with warm salt water. The discomfort should disappear within 24 hours. ° °If you had a scopolamine patch placed behind your ear for the management of post- operative nausea and/or vomiting: ° °1. The medication in the patch is effective for 72 hours, after which it should be removed.  Wrap patch in a tissue and discard in the trash. Wash hands thoroughly with soap and water. °2. You may remove the patch earlier than 72 hours if you experience unpleasant side effects which may include dry mouth, dizziness or visual disturbances. °3. Avoid touching the patch. Wash your hands with soap and water after contact with the patch. °  ° °

## 2016-05-01 NOTE — Anesthesia Postprocedure Evaluation (Signed)
Anesthesia Post Note  Patient: Loretta Rocha  Procedure(s) Performed: Procedure(s) (LRB): RADIOACTIVE SEED GUIDED LEFT BREAST LUMPECTOMY WITH AXILLARY SENTINEL LYMPH NODE BIOPSY (Left)  Patient location during evaluation: PACU Anesthesia Type: General Level of consciousness: awake and alert Pain management: pain level controlled Vital Signs Assessment: post-procedure vital signs reviewed and stable Respiratory status: spontaneous breathing, nonlabored ventilation, respiratory function stable and patient connected to nasal cannula oxygen Cardiovascular status: blood pressure returned to baseline and stable Postop Assessment: no signs of nausea or vomiting Anesthetic complications: no    Last Vitals:  Filed Vitals:   05/01/16 1545 05/01/16 1600  BP: 123/77 118/70  Pulse: 97 95  Temp:    Resp: 9 10    Last Pain:  Filed Vitals:   05/01/16 1602  PainSc: Asleep                 Afreen Siebels J

## 2016-05-01 NOTE — Anesthesia Preprocedure Evaluation (Signed)
Anesthesia Evaluation  Patient identified by MRN, date of birth, ID band Patient awake    Reviewed: Allergy & Precautions, NPO status , Patient's Chart, lab work & pertinent test results  Airway Mallampati: II  TM Distance: >3 FB Neck ROM: Full    Dental no notable dental hx.    Pulmonary former smoker,    Pulmonary exam normal breath sounds clear to auscultation       Cardiovascular negative cardio ROS Normal cardiovascular exam Rhythm:Regular Rate:Normal     Neuro/Psych  Neuromuscular disease negative psych ROS   GI/Hepatic negative GI ROS, Neg liver ROS,   Endo/Other  negative endocrine ROS  Renal/GU negative Renal ROS  negative genitourinary   Musculoskeletal  (+) Fibromyalgia -, narcotic dependent  Abdominal   Peds negative pediatric ROS (+)  Hematology negative hematology ROS (+)   Anesthesia Other Findings   Reproductive/Obstetrics negative OB ROS                             Anesthesia Physical Anesthesia Plan  ASA: II  Anesthesia Plan: General   Post-op Pain Management:    Induction: Intravenous  Airway Management Planned: LMA  Additional Equipment:   Intra-op Plan:   Post-operative Plan: Extubation in OR  Informed Consent: I have reviewed the patients History and Physical, chart, labs and discussed the procedure including the risks, benefits and alternatives for the proposed anesthesia with the patient or authorized representative who has indicated his/her understanding and acceptance.   Dental advisory given  Plan Discussed with: CRNA  Anesthesia Plan Comments:         Anesthesia Quick Evaluation

## 2016-05-01 NOTE — Progress Notes (Signed)
Emotional support during breast injections °

## 2016-05-01 NOTE — Anesthesia Procedure Notes (Addendum)
Anesthesia Regional Block:  Pectoralis block  Pre-Anesthetic Checklist: ,, timeout performed, Correct Patient, Correct Site, Correct Laterality, Correct Procedure, Correct Position, site marked, Risks and benefits discussed,  Surgical consent,  Pre-op evaluation,  At surgeon's request and post-op pain management  Laterality: Left and Upper  Prep: Betadine       Needles:  Injection technique: Single-shot     Needle Length: 10cm 10 cm Needle Gauge: 21 and 21 G    Additional Needles: Pectoralis block Narrative:  Injection made incrementally with aspirations every 5 mL.  Performed by: Personally  Anesthesiologist: Franne Grip  Additional Notes: Tolerated well   Procedure Name: LMA Insertion Performed by: Terrance Mass Pre-anesthesia Checklist: Patient identified, Emergency Drugs available, Suction available and Patient being monitored Patient Re-evaluated:Patient Re-evaluated prior to inductionOxygen Delivery Method: Circle System Utilized Preoxygenation: Pre-oxygenation with 100% oxygen Intubation Type: IV induction Ventilation: Mask ventilation without difficulty LMA: LMA inserted LMA Size: 4.0 Number of attempts: 1 Placement Confirmation: positive ETCO2 Tube secured with: Tape Dental Injury: Teeth and Oropharynx as per pre-operative assessment

## 2016-05-01 NOTE — Progress Notes (Signed)
Assisted Dr. Delma Post with left, ultrasound guided, pectoralis block. Side rails up, monitors on throughout procedure. See vital signs in flow sheet. Tolerated Procedure well.

## 2016-05-01 NOTE — Transfer of Care (Signed)
Immediate Anesthesia Transfer of Care Note  Patient: Loretta Rocha  Procedure(s) Performed: Procedure(s): RADIOACTIVE SEED GUIDED LEFT BREAST LUMPECTOMY WITH AXILLARY SENTINEL LYMPH NODE BIOPSY (Left)  Patient Location: PACU  Anesthesia Type:General  Level of Consciousness: awake and sedated  Airway & Oxygen Therapy: Patient Spontanous Breathing and Patient connected to face mask oxygen  Post-op Assessment: Report given to RN and Post -op Vital signs reviewed and stable  Post vital signs: Reviewed and stable  Last Vitals:  Filed Vitals:   05/01/16 1241 05/01/16 1242  BP:    Pulse: 94 93  Temp:    Resp: 13 8    Last Pain:  Filed Vitals:   05/01/16 1243  PainSc: 0-No pain      Patients Stated Pain Goal: 0 (16/24/46 9507)  Complications: No apparent anesthesia complications

## 2016-05-01 NOTE — Op Note (Signed)
Patient Name:           Loretta Rocha   Date of Surgery:        05/01/2016  Pre op Diagnosis:      Invasive ductal carcinoma left breast  Post op Diagnosis:    Same  Procedure:                 Inject blue dye left breast, left breast lumpectomy with radioactive seed localization, left axillary sentinel node biopsy  Surgeon:                     Edsel Petrin. 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 She was referred to me by Dr. [name] at the breast center of [facility] for evaluation of a newly diagnosed invasive duct carcinoma left breast 12 o'clock position. Dr. [name] is her PCP. She is scheduled to see Dr. [name] tomorrow for a preoperative medical oncology consultation.  The only prior breast problems she had was a left breast image guided biopsy 7 years ago in the inferior pole of the left breast which was benign. She gets annual mammograms. Her recent mammogram showed 2 densities. At the 12 o'clock position there was a 9 mm mass 5 cm from the nipple at the 10 o'clock position there was an 8 mm mass 4 cm from the nipple. Both of these areas were biopsied. The mass at 10:00 was benign fibrocystic change. The mass at 12:00 was invasive ductal carcinoma, grade 1.  Estrogen Just run receptor strongly positive.  HER-2 negative.     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 Biopsy-proven celiac disease. Fibromyalgia. She's had a vaginal hysterectomy without BSO for endometriosis. She takes Soma, tramadol, Cymbalta and hormone replacement therapy. She is going to wean off the estrogen hormone replacement therapy over the next 2 weeks. I told her that was essential.       Family history is negative for breast, ovarian, colon, or pancreatic  cancer.       We talked about her breast cancer. Clinically this is stage I. She prefers lumpectomy over mastectomy and I think she is an excellent candidate for that. I told her there was no survival advantage for mastectomy. She has seen Dr. [name] preop     She is brought to the operating room electively  Operative Findings:       The radioactive seed was placed yesterday and appears to be immediately adjacent to the original biopsy clip.  I was able to hear the audible signal of the radioactive seed in the holding area using the neoprobe.  She underwent injection of technetium radionuclide in the holding area by the nuclear medicine technician.  She underwent a left pectoral block by the anesthesiologist.   I found 2 sentinel lymph nodes.  The lumpectomy specimen mammogram looked very good with the marker clip and radioactive seed in the center of the specimen.  Procedure in Detail:          Following the induction of general LMA anesthesia a surgical timeout was performed.  Intravenous antibiotics were given.  Following alcohol prep I injected 5 mL of dilute methylene blue  in the left breast subareolar area and massaged the breast for a few minutes.  The left breast,  chest wall and axilla were then prepped and draped in a sterile fashion.      0.5% Marcaine with epinephrine was used as a local infiltration anesthetic.  Using the neoprobe I found the area of the maximal signal from the radioactive seed.  This was extremely high in the breast at the 12:00 position almost 15 cm above the areolar margin.  A transverse curvilinear incision was made at this point.  Dissection was carried down into the breast tissue and widely around the radioactive signal all the way down to the pectoralis fascia..    A broad posterior margin of the specimen was the pectoralis muscle.  The specimen was removed and marked with silk sutures and a 6 color ink kit to orient the pathologist.  The specimen mammogram looked  good.  The specimen was removed and sent to the lab.  Hemostasis was excellent.  Metallic marker clips were placed at the 5 cardinal positions of the cavity.     I then made a transverse incision in the left axilla at the hairline.  Dissection was carried down through the clavipectoral fascia entering the axillary space.  Using the neoprobe I found 2 sentinel lymph nodes.  These were fairly deep in the axilla.  After these 2 lymph nodes were removed there was essentially no radioactivity remaining and no other sentinel nodes were found.    Hemostasis was excellent and achieved with electrocautery.  The wound was irrigated with saline.  Both incisions were closed in layers with interrupted sutures of 3-0 Vicryl and both skin incisions were closed with a running subcuticular sutures of 4-0 Monocryl and Dermabond.  Cushioning bandages and a breast binder were placed.  The patient tolerated the procedure well was taken to PACU in stable condition.  EBL 20 mL or less.  Counts correct.  Complications none.            Edsel Petrin. Dalbert Batman, M.D., FACS General and Minimally Invasive Surgery Breast and Colorectal Surgery  05/01/2016 2:53 PM

## 2016-05-02 ENCOUNTER — Encounter (HOSPITAL_BASED_OUTPATIENT_CLINIC_OR_DEPARTMENT_OTHER): Payer: Self-pay | Admitting: General Surgery

## 2016-05-06 ENCOUNTER — Other Ambulatory Visit: Payer: Self-pay | Admitting: Oncology

## 2016-05-08 ENCOUNTER — Telehealth: Payer: Self-pay | Admitting: *Deleted

## 2016-05-08 ENCOUNTER — Other Ambulatory Visit: Payer: Self-pay | Admitting: Oncology

## 2016-05-08 NOTE — Telephone Encounter (Signed)
Ordered Oncotype Dx test per Dr. [name].  Faxed order to [facility].  Faxed order to [facility] and spoke with Varney Biles to verify she received it.  Placed a note to look for results.

## 2016-05-09 ENCOUNTER — Telehealth: Payer: Self-pay | Admitting: *Deleted

## 2016-05-09 NOTE — Telephone Encounter (Signed)
  Oncology Nurse Navigator Documentation    Navigator Encounter Type: Telephone (05/09/16 1300) Telephone: Loretta Rocha Call;Appt Confirmation/Clarification;Patient Update (05/09/16 1300)                                        Time Spent with Patient: 30 (05/09/16 1300)

## 2016-05-14 ENCOUNTER — Telehealth: Payer: Self-pay | Admitting: *Deleted

## 2016-05-14 NOTE — Telephone Encounter (Signed)
Spoke with patient to change her appointment with Dr. [name] due to oncotype results not ready yet.  Confirmed new appointment for 05/23/16 at 815am.

## 2016-05-16 ENCOUNTER — Ambulatory Visit: Payer: 59 | Admitting: Oncology

## 2016-05-16 ENCOUNTER — Ambulatory Visit (HOSPITAL_BASED_OUTPATIENT_CLINIC_OR_DEPARTMENT_OTHER): Payer: 59 | Admitting: Oncology

## 2016-05-16 ENCOUNTER — Encounter (HOSPITAL_COMMUNITY): Payer: Self-pay

## 2016-05-16 ENCOUNTER — Ambulatory Visit
Admission: RE | Admit: 2016-05-16 | Discharge: 2016-05-16 | Disposition: A | Discharge status: Home / Self Care | Payer: 59 | Source: Ambulatory Visit | Attending: Radiation Oncology | Admitting: Radiation Oncology

## 2016-05-16 VITALS — BP 131/79 | HR 86 | Temp 98.1°F | Resp 18 | Ht 63.0 in | Wt 133.9 lb

## 2016-05-16 DIAGNOSIS — C50212 Malignant neoplasm of upper-inner quadrant of left female breast: Secondary | ICD-10-CM

## 2016-05-16 DIAGNOSIS — Z170 Estrogen receptor positive status [ER+]: Secondary | ICD-10-CM | POA: Diagnosis not present

## 2016-05-16 DIAGNOSIS — Z856 Personal history of leukemia: Secondary | ICD-10-CM

## 2016-05-16 NOTE — Progress Notes (Signed)
[place]  [phone] Fax:(336) [phone]     ID: Danialle Dement DOB: Jan 25, 1956  MR#: [id number]  XBD#:[id number]  Patient Care Team: Jani Gravel, MD as PCP - General (Internal Medicine) Chauncey Cruel, MD as Consulting Physician (Oncology) Fanny Skates, MD as Consulting Physician (General Surgery) Laurence Spates, MD as Consulting Physician (Gastroenterology) PCP: Jani Gravel, MD GYN: SU:  OTHER MD:  CHIEF COMPLAINT: Estrogen receptor positive breast cancer   CURRENT TREATMENT: Adjuvant radiation pending   BREAST CANCER HISTORY: From the original intake note:  Aaliyha had routine screening mammography with tomography at the [facility] 04/16/2016. This found a possible distortion in the left breast. On 04/17/2016 she underwent diagnostic left mammography with tomography and ultrasonography. The breast density was category C. In the upper inner left breast there was a 0.7 cm obscured mass. A biopsy clip was in the inferior part of the breast. Physical exam found no palpable mass. Ultrasonography confirmed a 0.9 cm irregular echogenic mass at the 12:00 position 5 cm from the nipple, and a second mass at the 10:00 position 4 cm from the nipple measuring 0.7 cm. There was no left axillary adenopathy.  Biopsy of both masses was performed 04/21/2016. The more superior mass, measuring 0.9 cm on ultrasound, was invasive ductal carcinoma, grade 1, estrogen receptor 100% positive, progesterone receptor 100% positive, with strong staining intensity, with an MIB-1 of 15%, and no HER-2 amplification, the signals ratio being 1.79, and the number per cell 1.50 ([id number]). The second mass, at 10:00, showed fibrosis with no evidence of malignancy.  Her subsequent history is as detailed below  INTERVAL HISTORY: Shannon returns today for follow-up of her estrogen receptor positive breast cancer. Since her last visit here she underwent left lumpectomy and sentinel lymph node  sampling, on 05/01/2016. The final pathology ([id number]) showed invasive ductal carcinoma, grade 1, measuring 1.1 cm, with negative margins and both sentinel lymph nodes clear. Repeat HER-2 was again negative with a signals ratio of 1.06, the number per cell being 1.70.  We obtained an Oncotype DX from this sample, which showed a score of 10. This predicts a 10 year risk of recurrence outside the breast of 7% if the patient's only systemic therapy is tamoxifen for 5 years. It also predicts no benefit from chemotherapy.  REVIEW OF SYSTEMS: Jemya did well with her surgery, without significant swelling, pain, erythema, or bleeding she does describe herself as moderately fatigued. She does have some discomfort in the medial upper arm on the surgical side. She is still working full-time. She is having hot flashes. A detailed review of systems today was otherwise noncontributory   PAST MEDICAL HISTORY: Past Medical History  Diagnosis Date  . Breast cancer of upper-inner quadrant of left female breast ([place])   . Fibromyalgia   . IBS (irritable bowel syndrome)   . 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 Partial hysterectomy    . Abdominal hysterectomy    . Brain surgery  1982    has Omya reservoir placed after treatment for leukemia  . Radioactive seed guided mastectomy with axillary sentinel lymph node biopsy Left 05/01/2016    Procedure: RADIOACTIVE SEED GUIDED LEFT BREAST LUMPECTOMY WITH AXILLARY SENTINEL LYMPH NODE BIOPSY;  Surgeon: Fanny Skates, MD;  Location: [facility];  Service: General;  Laterality: Left;    FAMILY HISTORY No family history on file. The patient's father died at  age 32 with a history of lung cancer in the setting of tobacco abuse. The patient's mother is 39 years old as of June 2017. The patient has 2 brothers, no sisters. There is a history of prostate cancer on the  maternal side. There is no history of breast or ovarian cancer in the family.  GYNECOLOGIC HISTORY:  No LMP recorded. Patient has had a hysterectomy. Menarche age 17, first live birth age 17. She is GX P2. The patient underwent simple hysterectomy without salpingo-oophorectomy for endometriosis in 1994. She has been on hormone replacement approximately 6 years, stopping in June 2017.  SOCIAL HISTORY:  Zyra works as a Psychologist, sport and exercise for [employer]. She is divorced and lives alone with her terrier-yorkie mix and a cat. Daughter [name] lives in [place] where she works as a Education officer, museum. Daughter [name]  lives in [place] where she works as a Education administrator, hoping to go to [employer] school. The patient has a step daughter from her earlier marriage [name], who lives in [place]. [name] has one child and one on the way.     ADVANCED DIRECTIVES: Not in place   HEALTH MAINTENANCE: Social History  Substance Use Topics  . Smoking status: Former Smoker    Quit date: 04/27/1993  . Smokeless tobacco: Never Used  . Alcohol Use: 1.2 oz/week    2 Standard drinks or equivalent per week     Comment: social     Colonoscopy: October 2016/  PAP: Status post hysterectomy  Bone density: 2012?  Lipid panel:  Allergies  Allergen Reactions  . Morphine And Related Nausea Only  . Chlorhexidine Gluconate Rash  . Penicillins Rash    Received 2 GM Ancef with no obvious reaction.     Current Outpatient Prescriptions  Medication Sig Dispense Refill  . carisoprodol (SOMA) 350 MG tablet     . DULoxetine (CYMBALTA) 60 MG capsule Take 60 mg by mouth.    Marland Kitchen HYDROcodone-acetaminophen (NORCO) 5-325 MG tablet Take 1-2 tablets by mouth every 6 (six) hours as needed for moderate pain or severe pain. 30 tablet 0  . KRILL OIL PO Take by mouth.    . Multiple Vitamin (MULTIVITAMIN WITH MINERALS) TABS tablet Take 1 tablet by mouth daily.    . Red Yeast  Rice Extract (RED YEAST RICE PO) Take by mouth.    . TURMERIC CURCUMIN PO Take by mouth.    Marland Kitchen VITAMIN D, CHOLECALCIFEROL, PO Take 5,000 Units by mouth.     No current facility-administered medications for this visit.    OBJECTIVE: Middle-aged white woman who appears stated age  61 Vitals:   05/16/16 1233  BP: 131/79  Pulse: 86  Temp: 98.1 F (36.7 C)  Resp: 18     Body mass index is 23.73 kg/(m^2).    ECOG FS:1 - Symptomatic but completely ambulatory  Sclerae unicteric, pupils round and equal Oropharynx clear and moist-- no thrush or other lesions No cervical or supraclavicular adenopathy Lungs no rales or rhonchi Heart regular rate and rhythm Abd soft, nontender, positive bowel sounds MSK no focal spinal tenderness, no upper extremity lymphedema Neuro: nonfocal, well oriented, appropriate affect Breasts: The right breast is unremarkable. The left breast is status post recent lumpectomy. The incision is healing nicely, with no dehiscence, erythema, or swelling. Left axilla is benign.    LAB RESULTS:  CMP     Component Value Date/Time   NA 137 04/25/2016 1540   K 4.2 04/25/2016 1540  CO2 29 04/25/2016 1540   GLUCOSE 112 04/25/2016 1540   BUN 17.0 04/25/2016 1540   CREATININE 0.8 04/25/2016 1540   CALCIUM 9.6 04/25/2016 1540   PROT 7.8 04/25/2016 1540   ALBUMIN 4.0 04/25/2016 1540   AST 28 04/25/2016 1540   ALT 35 04/25/2016 1540   ALKPHOS 54 04/25/2016 1540   BILITOT <0.30 04/25/2016 1540    INo results found for: SPEP, UPEP  Lab Results  Component Value Date   WBC 11.4* 04/25/2016   NEUTROABS 8.2* 04/25/2016   HGB 11.3* 04/25/2016   HCT 34.3* 04/25/2016   MCV 90.0 04/25/2016   PLT 361 04/25/2016      Chemistry      Component Value Date/Time   NA 137 04/25/2016 1540   K 4.2 04/25/2016 1540   CO2 29 04/25/2016 1540   BUN 17.0 04/25/2016 1540   CREATININE 0.8 04/25/2016 1540      Component Value Date/Time   CALCIUM 9.6 04/25/2016 1540   ALKPHOS  54 04/25/2016 1540   AST 28 04/25/2016 1540   ALT 35 04/25/2016 1540   BILITOT <0.30 04/25/2016 1540       No results found for: LABCA2  No components found for: LABCA125  No results for input(s): INR in the last 168 hours.  Urinalysis No results found for: COLORURINE, APPEARANCEUR, LABSPEC, PHURINE, GLUCOSEU, HGBUR, BILIRUBINUR, KETONESUR, PROTEINUR, UROBILINOGEN, NITRITE, LEUKOCYTESUR      STUDIES: Nm Sentinel Node Inj-no Rpt (breast)  05/01/2016  CLINICAL DATA: ancer left breast Sulfur colloid was injected intradermally by the nuclear medicine technologist for breast cancer sentinel node localization.   Mm Breast Surgical Specimen  05/01/2016  CLINICAL DATA:  Status post excision of a left breast carcinoma following radioactive seed localization. EXAM: SPECIMEN RADIOGRAPH OF THE LEFT BREAST COMPARISON:  Previous exam(s). FINDINGS: Status post excision of the left breast. The radioactive seed and biopsy marker clip are present, completely intact, and were marked for pathology. IMPRESSION: Specimen radiograph of the left breast. Electronically Signed   By: Lajean Manes M.D.   On: 05/01/2016 14:21   US Breast Ltd Uni Left Inc Axilla  04/17/2016  CLINICAL DATA:  Patient recalled from screening for left breast distortion. EXAM: 2D DIGITAL DIAGNOSTIC LEFT MAMMOGRAM WITH CAD AND ADJUNCT TOMO ULTRASOUND LEFT BREAST COMPARISON:  Previous exam(s). ACR Breast Density Category c: The breast tissue is heterogeneously dense, which may obscure small masses. FINDINGS: Spot compression CC and MLO tomosynthesis images demonstrate persistent architectural distortion within the posterior superior left breast approximate 12 o'clock position. Within the upper inner left breast there is a 7 mm obscured mass. Biopsy marking clip is demonstrated with inferior left breast posterior depth. No additional concerning masses, calcifications or areas of architectural distortion identified. Mammographic images were  processed with CAD. On physical exam, I palpate no discrete mass within the superior and superior inner left breast. Targeted ultrasound is performed, showing a 9 x 5 x 8 mm irregular echogenic mass with associated distortion within the left breast 12 o'clock position 5 cm from the nipple, corresponding with mammographic area of distortion. Additionally within the left breast 10 o'clock position 4 cm from the nipple there is a round 7 x 7 x 8 mm circumscribed hypoechoic mass. No left axillary lymphadenopathy. IMPRESSION: Suspicious hyperechoic irregular left breast mass with associated distortion corresponding with mammographic abnormality. Suspicious left breast mass 10 o'clock position. RECOMMENDATION: Ultrasound-guided core needle biopsy suspicious left breast mass with distortion 12 o'clock position. Ultrasound-guided core needle biopsy suspicious left breast  mass 10 o'clock position. I have discussed the findings and recommendations with the patient. Results were also provided in writing at the conclusion of the visit. If applicable, a reminder letter will be sent to the patient regarding the next appointment. BI-RADS CATEGORY  4: Suspicious. Electronically Signed   By: Lovey Newcomer M.D.   On: 04/17/2016 15:36   Mm Diag Breast Tomo Uni Left  04/21/2016  CLINICAL DATA:  Evaluate placement of 2 biopsy clips following ultrasound-guided left breast biopsies. EXAM: DIAGNOSTIC LEFT MAMMOGRAM POST ULTRASOUND BIOPSY COMPARISON:  Previous exam(s). FINDINGS: Mammographic images were obtained following ultrasound guided biopsies of the 9 mm area of distortion at the 12 o'clock position of the left breast and an 8 mm hypoechoic mass at the 10 o'clock position of the left breast. The ribbon shaped clip corresponds to the area of biopsied distortion at the 12 o'clock position and appears to be in satisfactory position on the CC tomo views. This has a slightly superior position on the ML view when compared to the recent MLO  view, but clip placement was felt to be satisfactory sonographically. The coil shaped clip is in satisfactory position corresponding to the 8 mm mass biopsied at the 10 o'clock position. IMPRESSION: Clip placement as described above. The ribbon shaped clip corresponds to the distortion at the 12 o'clock position identified sonographically. The coil shaped clip corresponds to the 8 mm mass at the 10 o'clock position identified sonographically. Final Assessment: Post Procedure Mammograms for Marker Placement Electronically Signed   By: [name] M.D.   On: 04/21/2016 17:26   Mm Diag Breast Tomo Uni Left  04/17/2016  CLINICAL DATA:  Patient recalled from screening for left breast distortion. EXAM: 2D DIGITAL DIAGNOSTIC LEFT MAMMOGRAM WITH CAD AND ADJUNCT TOMO ULTRASOUND LEFT BREAST COMPARISON:  Previous exam(s). ACR Breast Density Category c: The breast tissue is heterogeneously dense, which may obscure small masses. FINDINGS: Spot compression CC and MLO tomosynthesis images demonstrate persistent architectural distortion within the posterior superior left breast approximate 12 o'clock position. Within the upper inner left breast there is a 7 mm obscured mass. Biopsy marking clip is demonstrated with inferior left breast posterior depth. No additional concerning masses, calcifications or areas of architectural distortion identified. Mammographic images were processed with CAD. On physical exam, I palpate no discrete mass within the superior and superior inner left breast. Targeted ultrasound is performed, showing a 9 x 5 x 8 mm irregular echogenic mass with associated distortion within the left breast 12 o'clock position 5 cm from the nipple, corresponding with mammographic area of distortion. Additionally within the left breast 10 o'clock position 4 cm from the nipple there is a round 7 x 7 x 8 mm circumscribed hypoechoic mass. No left axillary lymphadenopathy. IMPRESSION: Suspicious hyperechoic irregular left  breast mass with associated distortion corresponding with mammographic abnormality. Suspicious left breast mass 10 o'clock position. RECOMMENDATION: Ultrasound-guided core needle biopsy suspicious left breast mass with distortion 12 o'clock position. Ultrasound-guided core needle biopsy suspicious left breast mass 10 o'clock position. I have discussed the findings and recommendations with the patient. Results were also provided in writing at the conclusion of the visit. If applicable, a reminder letter will be sent to the patient regarding the next appointment. BI-RADS CATEGORY  4: Suspicious. Electronically Signed   By: Lovey Newcomer M.D.   On: 04/17/2016 15:36   Mm Lt Radioactive Seed Loc Mammo Guide  04/30/2016  CLINICAL DATA:  Patient presents for radioactive seed localization prior to lumpectomy for  left breast cancer. EXAM: MAMMOGRAPHIC GUIDED RADIOACTIVE SEED LOCALIZATION OF THE LEFT BREAST COMPARISON:  04/1916 and earlier FINDINGS: Patient presents for radioactive seed localization prior to lumpectomy. I met with the patient and we discussed the procedure of seed localization including benefits and alternatives. We discussed the high likelihood of a successful procedure. We discussed the risks of the procedure including infection, bleeding, tissue injury and further surgery. We discussed the low dose of radioactivity involved in the procedure. Informed, written consent was given. The usual time-out protocol was performed immediately prior to the procedure. Using mammographic guidance, sterile technique, 1% lidocaine and an I-125 radioactive seed, ribbon shaped clip in the upper-outer quadrant of the left breast was localized using a cephalad approach. The follow-up mammogram images confirm the seed in the expected location and were marked for Dr. Dalbert Batman. Follow-up survey of the patient confirms presence of the radioactive seed. Order number of I-125 seed:  [id number] to. Total activity:  2.694 millicuries   Reference Date: 04/18/2016 The patient tolerated the procedure well and was released from the [place]. She was given instructions regarding seed removal. IMPRESSION: Radioactive seed localization left breast. No apparent complications. Electronically Signed   By: Nolon Nations M.D.   On: 04/30/2016 15:07   [place] Lt Breast Bx W Loc Dev 1st Lesion Img Bx Spec US Guide  04/23/2016  ADDENDUM REPORT: 04/23/2016 08:42 ADDENDUM: Pathology revealed grade I invasive ductal carcinoma in the left breast at 12:00. The left breast at 10:00 showed fibrocystic changes with dense stromal fibrosis. This was found to be concordant by Dr. Hassan Rowan. Pathology results were discussed with the patient by telephone. The patient reported doing well after the biopsy with tenderness at the sites. Post biopsy instructions and care were reviewed and questions were answered. The patient was encouraged to call [facility] for any additional concerns. Surgical consultation has been arranged with Dr. Fanny Skates at [facility] on April 24, 2016. Pathology results reported by Susa Raring RN, BSN on 04/23/2016. Electronically Signed   By: [name] M.D.   On: 04/23/2016 08:42  04/23/2016  CLINICAL DATA:  60 year old female with 9 mm distortion within the upper left left breast EXAM: ULTRASOUND GUIDED LEFT BREAST CORE NEEDLE BIOPSY COMPARISON:  Previous exam(s). FINDINGS: I met with the patient and we discussed the procedure of ultrasound-guided biopsy, including benefits and alternatives. We discussed the high likelihood of a successful procedure. We discussed the risks of the procedure, including infection, bleeding, tissue injury, clip migration, and inadequate sampling. Informed written consent was given. The usual time-out protocol was performed immediately prior to the procedure. Using sterile technique and 1% Lidocaine as local anesthetic, under direct ultrasound visualization, a  12 gauge spring-loaded device was used to perform biopsy of 9 mm air distortion using a medial approach. At the conclusion of the procedure a ribbon shaped tissue marker clip was deployed into the biopsy cavity. Follow up 2 view mammogram was performed and dictated separately. IMPRESSION: Ultrasound guided biopsy of upper left breast distortion. No apparent complications. Pathology will be followed. Electronically Signed: By: [name] M.D. On: 04/21/2016 17:27   [place] Lt Breast Bx W Loc Dev Ea Add Lesion Img Bx Spec US Guide  04/23/2016  ADDENDUM REPORT: 04/23/2016 08:43 ADDENDUM: Pathology revealed grade I invasive ductal carcinoma in the left breast at 12:00. The left breast at 10:00 showed fibrocystic changes with dense stromal fibrosis. This was found to be concordant by Dr.  Hassan Rowan. Pathology results were discussed with the patient by telephone. The patient reported doing well after the biopsy with tenderness at the sites. Post biopsy instructions and care were reviewed and questions were answered. The patient was encouraged to call [facility] for any additional concerns. Surgical consultation has been arranged with Dr. Fanny Skates at [facility] on April 24, 2016. Pathology results reported by Susa Raring RN, BSN on 04/23/2016. Electronically Signed   By: [name] M.D.   On: 04/23/2016 08:43  04/23/2016  CLINICAL DATA:  60 year old female for tissue sampling of 8 mm mass in the upper inner upper left breast. EXAM: ULTRASOUND GUIDED LEFT BREAST CORE NEEDLE BIOPSY COMPARISON:  Previous exam(s). FINDINGS: I met with the patient and we discussed the procedure of ultrasound-guided biopsy, including benefits and alternatives. We discussed the high likelihood of a successful procedure. We discussed the risks of the procedure, including infection, bleeding, tissue injury, clip migration, and inadequate sampling. Informed written consent was given. The  usual time-out protocol was performed immediately prior to the procedure. Using sterile technique and 1% Lidocaine as local anesthetic, under direct ultrasound visualization, a 12 gauge spring-loaded device was used to perform biopsy of the 8 mm hypoechoic mass at the 10 o'clock position of the left breast using a medial approach. At the conclusion of the procedure a coil shaped tissue marker clip was deployed into the biopsy cavity. Follow up 2 view mammogram was performed and dictated separately. IMPRESSION: Ultrasound guided biopsy of upper inner left breast mass. No apparent complications. Pathology will be followed. Electronically Signed: By: [name] M.D. On: 04/21/2016 17:29    ELIGIBLE FOR AVAILABLE RESEARCH PROTOCOL: no  ASSESSMENT: 60 y.o. [place] woman status post left breast upper inner quadrant biopsy 04/21/2016 for a clinical T1b N0, stage IA invasive ductal carcinoma, strongly estrogen and progesterone receptor positive, HER-2 nonamplified, with an MIB-1 of 15%.   (1) status post left lumpectomy and sentinel lymph node sampling 05/01/2016 for a pT1c pN0, stage IA invasive ductal carcinoma, grade 1, with negative margins, and repeat HER-2 again negative  (2) Oncotype DX t score of 10 predicts a 10 year risk of recurrence outside the breast of 7% if the patient's only systemic therapy is tamoxifen for 5 years. It also predicts no benefit from adjuvant chemotherapy.  (3) adjuvant radiation to follow as appropriate  (4) anti-estrogens to follow at the completion of local treatment   PLAN: I spent approximately 30 minutes with Shiori going over her situation. She wanted to do "everything" to keep this from coming back, and was willing to give chemotherapy a try. However given her Oncotype, she would get 0-1% benefit from chemotherapy. Clearly it is not indicated. She was very reassured by this discussion.  Furthermore if she takes anastrozole instead of tamoxifen she can get an  additional 2-3% risk reduction, so that her final risk will be in the 5% or less range   She is now feeling much better about the plan. We are trying to move up the appointment with radiation oncology so Doretta can go on a trip that she has scheduled around September 1. She is going to return to see me September 18. At that time we will discuss anastrozole and prior to that visit [facility] am [facility].  She knows to call for any problems that may develop before then.   Chauncey Cruel, MD  05/16/2016 2:34 PM Medical Oncology and Hematology [facility] [address] [place], [place] [address] Tel. [phone]    Fax. [phone]

## 2016-05-19 ENCOUNTER — Ambulatory Visit
Admission: RE | Admit: 2016-05-19 | Discharge: 2016-05-19 | Disposition: A | Discharge status: Home / Self Care | Payer: 59 | Source: Ambulatory Visit | Attending: Radiation Oncology | Admitting: Radiation Oncology

## 2016-05-19 ENCOUNTER — Encounter: Payer: Self-pay | Admitting: Radiation Oncology

## 2016-05-19 ENCOUNTER — Ambulatory Visit
Admission: RE | Admit: 2016-05-19 | Discharge: 2016-05-19 | Disposition: A | Discharge status: Home / Self Care | Payer: 59 | Source: Ambulatory Visit | Admitting: Radiation Oncology

## 2016-05-19 VITALS — BP 107/66 | HR 91 | Temp 97.7°F | Resp 20 | Ht 62.0 in | Wt 134.3 lb

## 2016-05-19 DIAGNOSIS — C50212 Malignant neoplasm of upper-inner quadrant of left female breast: Secondary | ICD-10-CM

## 2016-05-19 NOTE — Progress Notes (Signed)
Location of Breast Cancer: Left Breast  Histology per Pathology Report:  04/21/16 Diagnosis 1. Breast, left, needle core biopsy, 12:00 o'clock position - INVASIVE DUCTAL CARCINOMA. - SEE COMMENT. 2. Breast, left, needle core biopsy, 10:00 o'clock position - FIBROCYSTIC CHANGES WITH DENSE STROMAL FIBROSIS. - THERE IS NO EVIDENCE OF MALIGNANCY.  Receptor Status: ER(100%), PR (100%), Her2-neu (NEG), Ki-(15%)  05/01/16 Diagnosis 1. 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 - DUCTAL CARCINOMA IN SITU, LOW GRADE. - THE SURGICAL RESECTION MARGINS ARE NEGATIVE FOR CARCINOMA. - SEE ONCOLOGY TABLE BELOW. 2. Lymph node, sentinel, biopsy, Left Axillary - THERE IS NO EVIDENCE OF CARCINOMA IN 1 OF 1 LYMPH NODE (0/1). 3. Lymph node, sentinel, biopsy, Left Axillary - THERE IS NO EVIDENCE OF CARCINOMA IN 1 OF 1 LYMPH NODE (0/1).  Did patient present with symptoms or was this found on screening mammography?: It was found on a screening mammogram.  Past/Anticipated interventions by surgeon, if any: 05/01/16 Procedure: Inject blue dye left breast, left breast lumpectomy with radioactive seed localization, left axillary sentinel node biopsy Surgeon: Edsel Petrin. Dalbert Batman, M.D., [facility]  Past/Anticipated interventions by medical oncology, if any:  04/25/16 Dr. [name] (1) breast conserving surgery with sentinel lymph node sampling pending (2) Oncotype DX to be obtained from the final surgical samples (3) adjuvant radiation to follow as appropriate (4) anti-estrogens to follow at the completion of local treatment   Lymphedema issues, if any: NO  Pain issues, if any:  Soreness, stabbing occasionally nerve pain under arm has   Improved, numbness under arm and nipple   SAFETY ISSUES: No  Prior radiation?  NO  Pacemaker/ICD?  No  Possible current pregnancy? No  Is the patient on methotrexate? No    Current Complaints / other details:   GYNECOLOGIC HISTORY:  No LMP recorded.  Menarche age 46, first live birth age 34.  She is GX P2.  The patient underwent simple hysterectomy without salpingo-oophorectomy for endometriosis in 1994.  She has been on hormone replacement approximately 6 years, stopping in June 2017. No family  hc breast or ovarian cancer,  Rebecca Eaton, RN 05/19/2016,8:07 AM  BP 107/66 mmHg  Pulse 91  Temp(Src) 97.7 F (36.5 C) (Oral)  Resp 20  Ht 5' 2" (1.575 m)  Wt 134 lb 4.8 oz (60.918 kg)  BMI 24.56 kg/m2  Wt Readings from Last 3 Encounters:  05/19/16 134 lb 4.8 oz (60.918 kg)  05/16/16 133 lb 14.4 oz (60.737 kg)  05/01/16 132 lb (59.875 kg)

## 2016-05-19 NOTE — Progress Notes (Signed)
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 Breast cancer of upper-inner quadrant of left female breast ([place]) 174.2 C50.212     HISTORY OF PRESENT ILLNESS::Loretta Rocha is a 60 y.o. female who presented with an abnormal routine screening mammogram 04/16/2016 that found a possible distortion in the left breast. She underwent a diagnostic mammogram and ultrasound 04/17/2016, revealing a 0.7 cm obscured mass in the upper inner left breast. Ultrasound confirmed a 0.9 cm irregular echogenic mass at the 12:00 position 5 CFN and a second mass at the 10:00 o 'clock position 4 CFN measuring 0.7 cm. There was no left axillar adenopathy. She had a biopsy of both the masses 04/21/2016, revealing the larger mass was invasive ductal carcinoma, grade 1, ER (100%), PR (100%), Her2-neu negative, Ki 67 (15%). The smaller mass showed fibrosis with no evidence of malignancy.   She had a lumpectomy and sentinel lymph node sampling 05/01/2016, revealing invasive ductal carcinoma, grade 1, measuring 1.1 cm with negative margins and the sentinel lymph nodes were clear. She had Oncotype testing performed, which showed a score of 10. She will not receive chemotherapy. 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  She mentions that she has some soreness. Sh had some stabbing nerve pain under her arm that has improved. She mentions that she has some numbness under her arm and nipple. She had been on hormone replacement therapy approximately 6 years and stopped in June 2017.  She has a vacation planned September 1st - 4th and would like to finish  radiation therapy before this.  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    PAST SURGICAL HISTORY: Past Surgical History  Procedure Laterality Date  . Partial hysterectomy    . Abdominal hysterectomy    . Brain surgery  1982    has [place] reservoir placed after treatment for leukemia  . Radioactive seed guided mastectomy with axillary sentinel lymph node biopsy Left 05/01/2016    Procedure: RADIOACTIVE SEED GUIDED LEFT BREAST LUMPECTOMY WITH AXILLARY SENTINEL LYMPH NODE BIOPSY;  Surgeon: Fanny Skates, MD;  Location: [facility];  Service: General;  Laterality: Left;    FAMILY HISTORY: family history is not on file.  SOCIAL HISTORY:  reports that she quit smoking about 23 years ago. She has never used smokeless tobacco. She reports that she drinks about 1.2 oz of alcohol per week. She reports that she does not use illicit drugs.  ALLERGIES: Morphine and related; Chlorhexidine gluconate; and Penicillins  MEDICATIONS:  Current Outpatient Prescriptions  Medication Sig Dispense Refill  . ALPRAZolam (XANAX) 0.5 MG tablet Take 0.5 mg by mouth at bedtime as needed for anxiety (1/2 tab prn).    . carisoprodol (SOMA) 350 MG tablet Take 350 mg by mouth 2 (two) times daily as needed.     . diphenhydrAMINE (SOMINEX) 25 MG tablet Take 25 mg by mouth at bedtime as needed for sleep.    Marland Kitchen  DULoxetine (CYMBALTA) 60 MG capsule Take 60 mg by mouth daily.     Marland Kitchen HYDROcodone-acetaminophen (NORCO) 5-325 MG tablet Take 1-2 tablets by mouth every 6 (six) hours as needed for moderate pain or severe pain. 30 tablet 0  . KRILL OIL PO Take 1 capsule by mouth daily.     . magnesium hydroxide (MILK OF MAGNESIA) 400 MG/5ML suspension Take 30 mLs by mouth daily as needed for mild constipation.    .  Multiple Vitamin (MULTIVITAMIN WITH MINERALS) TABS tablet Take 1 tablet by mouth daily.    . Red Yeast Rice Extract (RED YEAST RICE PO) Take 1 tablet by mouth 2 (two) times daily.     . TURMERIC CURCUMIN PO Take 1 capsule by mouth daily.     Marland Kitchen VITAMIN D, CHOLECALCIFEROL, PO Take 5,000 Units by mouth.     No current facility-administered medications for this encounter.    REVIEW OF SYSTEMS:  Notable for that above.   PHYSICAL EXAM:  height is 5' 2"  (1.575 m) and weight is 134 lb 4.8 oz (60.918 kg). Her oral temperature is 97.7 F (36.5 C). Her blood pressure is 107/66 and her pulse is 91. Her respiration is 20.    General: Alert and oriented, in no acute distress HEENT: Head is normocephalic. Extraocular movements are intact. Oropharynx is clear. Neck: Neck is supple, no palpable cervical or supraclavicular lymphadenopathy. Heart: Regular in rate and rhythm with no murmurs, rubs, or gallops. Chest: Clear to auscultation bilaterally, with no rhonchi, wheezes, or rales. Abdomen: Soft, nontender, nondistended, with no rigidity or guarding. Extremities: No cyanosis or edema. Lymphatics: see Neck Exam Skin: No concerning lesions. Musculoskeletal: symmetric strength and muscle tone throughout. Neurologic: Cranial nerves II through XII are grossly intact. No obvious focalities. Speech is fluent. Coordination is intact. Psychiatric: Judgment and insight are intact. Affect is appropriate. Breast: The left breast has a well-healed lumpectomy and axillary scars. The lumpectomy scar is in the 11:30 position. The right breast has no concerning masses appreciated in the right breast or axilla.  ECOG = 0  0 - Asymptomatic (Fully active, able to carry on all predisease activities without restriction)  1 - Symptomatic but completely ambulatory (Restricted in physically strenuous activity but ambulatory and able to carry out work of a light or sedentary nature. For example, light housework, office  work)  2 - Symptomatic, <50% in bed during the day (Ambulatory and capable of all self care but unable to carry out any work activities. Up and about more than 50% of waking hours)  3 - Symptomatic, >50% in bed, but not bedbound (Capable of only limited self-care, confined to bed or chair 50% or more of waking hours)  4 - Bedbound (Completely disabled. Cannot carry on any self-care. Totally confined to bed or chair)  5 - Death   Eustace Pen MM, Creech RH, Tormey DC, et al. [phone]). "Toxicity and response criteria of the [facility] Group". [place] Oncol. 5 (6): 649-55   LABORATORY DATA:  Lab Results  Component Value Date   WBC 11.4* 04/25/2016   HGB 11.3* 04/25/2016   HCT 34.3* 04/25/2016   MCV 90.0 04/25/2016   PLT 361 04/25/2016   CMP     Component Value Date/Time   NA 137 04/25/2016 1540   K 4.2 04/25/2016 1540   CO2 29 04/25/2016 1540   GLUCOSE 112 04/25/2016 1540   BUN 17.0 04/25/2016 1540   CREATININE 0.8 04/25/2016 1540   CALCIUM 9.6 04/25/2016 1540  PROT 7.8 04/25/2016 1540   ALBUMIN 4.0 04/25/2016 1540   AST 28 04/25/2016 1540   ALT 35 04/25/2016 1540   ALKPHOS 54 04/25/2016 1540   BILITOT <0.30 04/25/2016 1540         RADIOGRAPHY: Nm Sentinel Node Inj-no Rpt (breast)  05/01/2016  CLINICAL DATA: ancer left breast Sulfur colloid was injected intradermally by the nuclear medicine technologist for breast cancer sentinel node localization.   Mm Breast Surgical Specimen  05/01/2016  CLINICAL DATA:  Status post excision of a left breast carcinoma following radioactive seed localization. EXAM: SPECIMEN RADIOGRAPH OF THE LEFT BREAST COMPARISON:  Previous exam(s). FINDINGS: Status post excision of the left breast. The radioactive seed and biopsy marker clip are present, completely intact, and were marked for pathology. IMPRESSION: Specimen radiograph of the left breast. Electronically Signed   By: Lajean Manes M.D.   On: 05/01/2016 14:21   Mm Diag  Breast Tomo Uni Left  04/21/2016  CLINICAL DATA:  Evaluate placement of 2 biopsy clips following ultrasound-guided left breast biopsies. EXAM: DIAGNOSTIC LEFT MAMMOGRAM POST ULTRASOUND BIOPSY COMPARISON:  Previous exam(s). FINDINGS: Mammographic images were obtained following ultrasound guided biopsies of the 9 mm area of distortion at the 12 o'clock position of the left breast and an 8 mm hypoechoic mass at the 10 o'clock position of the left breast. The ribbon shaped clip corresponds to the area of biopsied distortion at the 12 o'clock position and appears to be in satisfactory position on the CC tomo views. This has a slightly superior position on the ML view when compared to the recent MLO view, but clip placement was felt to be satisfactory sonographically. The coil shaped clip is in satisfactory position corresponding to the 8 mm mass biopsied at the 10 o'clock position. IMPRESSION: Clip placement as described above. The ribbon shaped clip corresponds to the distortion at the 12 o'clock position identified sonographically. The coil shaped clip corresponds to the 8 mm mass at the 10 o'clock position identified sonographically. Final Assessment: Post Procedure Mammograms for Marker Placement Electronically Signed   By: [name] M.D.   On: 04/21/2016 17:26   Mm Lt Radioactive Seed Loc Mammo Guide  04/30/2016  CLINICAL DATA:  Patient presents for radioactive seed localization prior to lumpectomy for left breast cancer. EXAM: MAMMOGRAPHIC GUIDED RADIOACTIVE SEED LOCALIZATION OF THE LEFT BREAST COMPARISON:  04/1916 and earlier FINDINGS: Patient presents for radioactive seed localization prior to lumpectomy. I met with the patient and we discussed the procedure of seed localization including benefits and alternatives. We discussed the high likelihood of a successful procedure. We discussed the risks of the procedure including infection, bleeding, tissue injury and further surgery. We discussed the low dose of  radioactivity involved in the procedure. Informed, written consent was given. The usual time-out protocol was performed immediately prior to the procedure. Using mammographic guidance, sterile technique, 1% lidocaine and an I-125 radioactive seed, ribbon shaped clip in the upper-outer quadrant of the left breast was localized using a cephalad approach. The follow-up mammogram images confirm the seed in the expected location and were marked for Dr. Dalbert Batman. Follow-up survey of the patient confirms presence of the radioactive seed. Order number of I-125 seed:  [id number] to. Total activity:  8.921 millicuries  Reference Date: 04/18/2016 The patient tolerated the procedure well and was released from the [facility]. She was given instructions regarding seed removal. IMPRESSION: Radioactive seed localization left breast. No apparent complications. Electronically Signed   By: Nolon Nations M.D.  On: 04/30/2016 15:07   [place] Lt Breast Bx W Loc Dev 1st Lesion Img Bx Spec US Guide  04/23/2016  ADDENDUM REPORT: 04/23/2016 08:42 ADDENDUM: Pathology revealed grade I invasive ductal carcinoma in the left breast at 12:00. The left breast at 10:00 showed fibrocystic changes with dense stromal fibrosis. This was found to be concordant by Dr. Hassan Rowan. Pathology results were discussed with the patient by telephone. The patient reported doing well after the biopsy with tenderness at the sites. Post biopsy instructions and care were reviewed and questions were answered. The patient was encouraged to call [facility] for any additional concerns. Surgical consultation has been arranged with Dr. Fanny Skates at [facility] on April 24, 2016. Pathology results reported by Susa Raring RN, BSN on 04/23/2016. Electronically Signed   By: [name] M.D.   On: 04/23/2016 08:42  04/23/2016  CLINICAL DATA:  60 year old female with 9 mm distortion within the upper left left breast EXAM:  ULTRASOUND GUIDED LEFT BREAST CORE NEEDLE BIOPSY COMPARISON:  Previous exam(s). FINDINGS: I met with the patient and we discussed the procedure of ultrasound-guided biopsy, including benefits and alternatives. We discussed the high likelihood of a successful procedure. We discussed the risks of the procedure, including infection, bleeding, tissue injury, clip migration, and inadequate sampling. Informed written consent was given. The usual time-out protocol was performed immediately prior to the procedure. Using sterile technique and 1% Lidocaine as local anesthetic, under direct ultrasound visualization, a 12 gauge spring-loaded device was used to perform biopsy of 9 mm air distortion using a medial approach. At the conclusion of the procedure a ribbon shaped tissue marker clip was deployed into the biopsy cavity. Follow up 2 view mammogram was performed and dictated separately. IMPRESSION: Ultrasound guided biopsy of upper left breast distortion. No apparent complications. Pathology will be followed. Electronically Signed: By: [name] M.D. On: 04/21/2016 17:27   [place] Lt Breast Bx W Loc Dev Ea Add Lesion Img Bx Spec US Guide  04/23/2016  ADDENDUM REPORT: 04/23/2016 08:43 ADDENDUM: Pathology revealed grade I invasive ductal carcinoma in the left breast at 12:00. The left breast at 10:00 showed fibrocystic changes with dense stromal fibrosis. This was found to be concordant by Dr. Hassan Rowan. Pathology results were discussed with the patient by telephone. The patient reported doing well after the biopsy with tenderness at the sites. Post biopsy instructions and care were reviewed and questions were answered. The patient was encouraged to call [facility] for any additional concerns. Surgical consultation has been arranged with Dr. Fanny Skates at [facility] on April 24, 2016. Pathology results reported by Susa Raring RN, BSN on 04/23/2016. Electronically Signed    By: [name] M.D.   On: 04/23/2016 08:43  04/23/2016  CLINICAL DATA:  60 year old female for tissue sampling of 8 mm mass in the upper inner upper left breast. EXAM: ULTRASOUND GUIDED LEFT BREAST CORE NEEDLE BIOPSY COMPARISON:  Previous exam(s). FINDINGS: I met with the patient and we discussed the procedure of ultrasound-guided biopsy, including benefits and alternatives. We discussed the high likelihood of a successful procedure. We discussed the risks of the procedure, including infection, bleeding, tissue injury, clip migration, and inadequate sampling. Informed written consent was given. The usual time-out protocol was performed immediately prior to the procedure. Using sterile technique and 1% Lidocaine as local anesthetic, under direct ultrasound visualization, a 12 gauge spring-loaded device was used to perform biopsy of  the 8 mm hypoechoic mass at the 10 o'clock position of the left breast using a medial approach. At the conclusion of the procedure a coil shaped tissue marker clip was deployed into the biopsy cavity. Follow up 2 view mammogram was performed and dictated separately. IMPRESSION: Ultrasound guided biopsy of upper inner left breast mass. No apparent complications. Pathology will be followed. Electronically Signed: By: [name] M.D. On: 04/21/2016 17:29    IMPRESSION/PLAN: Loretta Rocha is a 60 yo woman with grade 1, Stage IA invasive ductal carcinoma in the left female breast. She is a good candidate for external radiation.  We discussed the role of radiation and its side effects. We discussed that she would need 4 weeks of treatment. She has a CT simulation appointment scheduled today at 9 am. We will start treatment in the middle of next week and she will finish at the end of August.  I reminded her when she goes on vacation after treatment to continue to move when travelling to prevent blood clots.   It was a pleasure meeting the patient today. We discussed the risks, benefits,  and side effects of radiotherapy.  We spoke about acute effects including skin irritation and fatigue as well as much less common late effects including lung and heart irritation. We spoke about the latest technology that is used to minimize the risk of late effects for breast cancer patients undergoing radiotherapy. No guarantees of treatment were given. The patient is enthusiastic about proceeding with treatment. I look forward to participating in the patient's care. Consent signed today.  __________________________________________   Eppie Gibson, MD    This document serves as a record of services personally performed by Eppie Gibson, MD. It was created on her behalf by Lendon Collar, a trained medical scribe. The creation of this record is based on the scribe's personal observations and the provider's statements to them. This document has been checked and approved by the attending provider.

## 2016-05-19 NOTE — Progress Notes (Signed)
Radiation Oncology         [phone] ________________________________  Name: Loretta Rocha MRN: [id number]  Date: 05/19/2016  DOB: 12-06-1955  SIMULATION AND TREATMENT PLANNING NOTE / Special treatment procedure  Outpatient  DIAGNOSIS:     ICD-9-CM ICD-10-CM   1. Breast cancer of upper-inner quadrant of left female breast ([place]) 174.2 C50.212     NARRATIVE:  The patient was brought to the [facility].  Identity was confirmed.  All relevant records and images related to the planned course of therapy were reviewed.  The patient freely provided informed written consent to proceed with treatment after reviewing the details related to the planned course of therapy. The consent form was witnessed and verified by the simulation staff.    Then, the patient was set-up in a stable reproducible supine position for radiation therapy with her ipsilateral arm over her head, and her upper body secured in a custom-made Vac-lok device.  CT images were obtained.  Surface markings were placed.  The CT images were loaded into the planning software.    Special treatment procedure:  Special treatment procedure was performed today due to the extra time and effort required by myself to plan and prepare this patient for deep inspiration breath hold technique.  I have determined cardiac sparing to be of benefit to this patient to prevent long term cardiac damage due to radiation of the heart.  Bellows were placed on the patient's abdomen. To facilitate cardiac sparing, the patient was coached by the radiation therapists on breath hold techniques and breathing practice was performed. Practice waveforms were obtained. The patient was then scanned while maintaining breath hold in the treatment position.  This image was then transferred over to the imaging specialist. The imaging specialist then created a fusion of the free breathing and breath hold scans using the chest wall as the stable structure. I  personally reviewed the fusion in axial, coronal and sagittal image planes.  Excellent cardiac sparing was obtained.  I felt the patient is an appropriate candidate for breath hold and the patient will be treated as such.  The image fusion was then reviewed with the patient to reinforce the necessity of reproducible breath hold.  TREATMENT PLANNING NOTE: Treatment planning then occurred.  The radiation prescription was entered and confirmed.     A total of 3 medically necessary complex treatment devices were fabricated and supervised by me: 2 fields with MLCs for custom blocks to protect heart, and lungs;  and, a Vac-lok. MORE COMPLEX DEVICES MAY BE MADE IN DOSIMETRY FOR FIELD IN FIELD BEAMS FOR DOSE HOMOGENEITY.  I have requested : 3D Simulation  I have requested a DVH of the following structures: lungs, heart, lumpectomy cavity.    The patient will receive 40.05 Gy in 15 fractions to the left breast with 2 tangential fields.  This will be followed by a boost.  Optical Surface Tracking Plan:  Since intensity modulated radiotherapy (IMRT) and 3D conformal radiation treatment methods are predicated on accurate and precise positioning for treatment, intrafraction motion monitoring is medically necessary to ensure accurate and safe treatment delivery. The ability to quantify intrafraction motion without excessive ionizing radiation dose can only be performed with optical surface tracking. Accordingly, surface imaging offers the opportunity to obtain 3D measurements of patient position throughout IMRT and 3D treatments without excessive radiation exposure. I am ordering optical surface tracking for this patient's upcoming course of radiotherapy.  ________________________________   Reference:  Ursula Alert,  J, et al. Surface imaging-based analysis of intrafraction motion for breast radiotherapy patients.Journal of [place], n. 6, nov. 2014. ISSN [id number].    Available at: <http://www.jacmp.org/index.php/jacmp/article/view/4957>.    -----------------------------------  Eppie Gibson, MD

## 2016-05-22 ENCOUNTER — Inpatient Hospital Stay: Admission: RE | Admit: 2016-05-22 | Payer: 59 | Source: Ambulatory Visit

## 2016-05-22 ENCOUNTER — Ambulatory Visit: Payer: 59

## 2016-05-22 ENCOUNTER — Ambulatory Visit: Admission: RE | Admit: 2016-05-22 | Payer: 59 | Source: Ambulatory Visit | Admitting: Radiation Oncology

## 2016-05-22 ENCOUNTER — Ambulatory Visit: Payer: 59 | Admitting: Radiation Oncology

## 2016-05-23 ENCOUNTER — Ambulatory Visit: Payer: 59 | Admitting: Oncology

## 2016-05-26 DIAGNOSIS — C50212 Malignant neoplasm of upper-inner quadrant of left female breast: Secondary | ICD-10-CM | POA: Diagnosis not present

## 2016-05-27 ENCOUNTER — Other Ambulatory Visit: Payer: Self-pay | Admitting: *Deleted

## 2016-05-27 ENCOUNTER — Institutional Professional Consult (permissible substitution): Payer: 59 | Admitting: Radiation Oncology

## 2016-05-28 ENCOUNTER — Encounter: Payer: Self-pay | Admitting: Radiation Oncology

## 2016-05-28 ENCOUNTER — Ambulatory Visit
Admission: RE | Admit: 2016-05-28 | Discharge: 2016-05-28 | Disposition: A | Discharge status: Home / Self Care | Payer: 59 | Source: Ambulatory Visit | Attending: Radiation Oncology | Admitting: Radiation Oncology

## 2016-05-28 DIAGNOSIS — C50212 Malignant neoplasm of upper-inner quadrant of left female breast: Secondary | ICD-10-CM | POA: Diagnosis not present

## 2016-05-28 NOTE — Progress Notes (Signed)
Received FMLA and disability paperwork from patient.  Forwarded to RN for processing.

## 2016-05-28 NOTE — Progress Notes (Signed)
Photon Boost Complex Emergency planning/management officer Note Breast cancer of upper-inner quadrant of left female breast ([place]) 174.2 C50.212    Diagnosis: Breast Cancer  The patient's CT images from her free-breathing simulation were reviewed to plan her boost treatment to her left breast  lumpectomy cavity.  The boost to the lumpectomy cavity will be delivered with 3 photon fields using MLCs for custom blocks again heart and lungs.  This constitutes 3 complex treatment devices. Isodose plan was reviewed and approved. 10 Gy in 5 fractions prescribed.  -----------------------------------  Eppie Gibson, MD

## 2016-05-29 ENCOUNTER — Ambulatory Visit
Admission: RE | Admit: 2016-05-29 | Discharge: 2016-05-29 | Disposition: A | Discharge status: Home / Self Care | Payer: 59 | Source: Ambulatory Visit | Attending: Radiation Oncology | Admitting: Radiation Oncology

## 2016-05-29 DIAGNOSIS — C50212 Malignant neoplasm of upper-inner quadrant of left female breast: Secondary | ICD-10-CM | POA: Diagnosis not present

## 2016-05-29 NOTE — Progress Notes (Signed)
Location of Breast Cancer: Left Breast  Histology per Pathology Report:  04/21/16 Diagnosis 1. Breast, left, needle core biopsy, 12:00 o'clock position - INVASIVE DUCTAL CARCINOMA. - SEE COMMENT. 2. Breast, left, needle core biopsy, 10:00 o'clock position - FIBROCYSTIC CHANGES WITH DENSE STROMAL FIBROSIS. - THERE IS NO EVIDENCE OF MALIGNANCY.  Receptor Status: ER(100%), PR (100%), Her2-neu (NEG), Ki-(15%)  05/01/16 Diagnosis 1. 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 - DUCTAL CARCINOMA IN SITU, LOW GRADE. - THE SURGICAL RESECTION MARGINS ARE NEGATIVE FOR CARCINOMA. - SEE ONCOLOGY TABLE BELOW. 2. Lymph node, sentinel, biopsy, Left Axillary - THERE IS NO EVIDENCE OF CARCINOMA IN 1 OF 1 LYMPH NODE (0/1). 3. Lymph node, sentinel, biopsy, Left Axillary - THERE IS NO EVIDENCE OF CARCINOMA IN 1 OF 1 LYMPH NODE (0/1).  Did patient present with symptoms or was this found on screening mammography?: It was found on a screening mammogram.  Past/Anticipated interventions by surgeon, if any: 05/01/16 Procedure: Inject blue dye left breast, left breast lumpectomy with radioactive seed localization, left axillary sentinel node biopsy Surgeon: Edsel Petrin. Dalbert Batman, M.D., [facility]  Past/Anticipated interventions by medical oncology, if any:  04/25/16 Dr. [name] (1) breast conserving surgery with sentinel lymph node sampling pending (2) Oncotype DX to be obtained from the final surgical samples (3) adjuvant radiation to follow as appropriate (4) anti-estrogens to follow at the completion of local treatment   Lymphedema issues, if any:  Pain issues, if any:    SAFETY ISSUES:  Prior radiation?   Pacemaker/ICD?   Possible current pregnancy? No  Is the patient on methotrexate?   Current Complaints / other details:   GYNECOLOGIC HISTORY:  No LMP recorded. Menarche age 89, first live birth age  36.  She is GX P2.  The patient underwent simple hysterectomy without salpingo-oophorectomy for endometriosis in 1994.  She has been on hormone replacement approximately 6 years, stopping in June 2017.    Lyndell Gillyard, Stephani Police, RN 05/29/2016,9:01 AM

## 2016-05-30 ENCOUNTER — Ambulatory Visit
Admission: RE | Admit: 2016-05-30 | Discharge: 2016-05-30 | Disposition: A | Discharge status: Home / Self Care | Payer: 59 | Source: Ambulatory Visit | Attending: Radiation Oncology | Admitting: Radiation Oncology

## 2016-05-30 DIAGNOSIS — C50212 Malignant neoplasm of upper-inner quadrant of left female breast: Secondary | ICD-10-CM

## 2016-05-30 MED ORDER — ALRA NON-METALLIC DEODORANT (RAD-ONC)
1.0000 "application " | Freq: Once | TOPICAL | Status: AC
  Administered 2016-05-30: 1 via TOPICAL

## 2016-05-30 MED ORDER — RADIAPLEXRX EX GEL
Freq: Once | CUTANEOUS | Status: AC
  Administered 2016-05-30: 17:00:00 via TOPICAL

## 2016-05-30 NOTE — Progress Notes (Signed)
Pt here for patient teaching.  Pt given Radiation and You booklet, skin care instructions, Alra deodorant and Radiaplex gel. Pt reports they have not watched the Radiation Therapy Education video, but were given the link to watch at home.  Reviewed areas of pertinence such as fatigue, skin changes, breast tenderness, breast swelling, cough, shortness of breath, earaches and taste changes . Pt able to give teach back of to pat skin, use unscented/gentle soap and drink plenty of water,apply Radiaplex bid, avoid applying anything to skin within 4 hours of treatment, avoid wearing an under wire bra and to use an electric razor if they must shave. Pt verbalizes understanding of information given and will contact nursing with any questions or concerns.     [email or web address]

## 2016-06-02 ENCOUNTER — Encounter: Payer: Self-pay | Admitting: Radiation Oncology

## 2016-06-02 ENCOUNTER — Ambulatory Visit
Admission: RE | Admit: 2016-06-02 | Discharge: 2016-06-02 | Disposition: A | Discharge status: Home / Self Care | Payer: 59 | Source: Ambulatory Visit | Attending: Radiation Oncology | Admitting: Radiation Oncology

## 2016-06-02 VITALS — BP 118/78 | HR 80 | Temp 97.6°F | Ht 62.0 in | Wt 135.9 lb

## 2016-06-02 DIAGNOSIS — C50212 Malignant neoplasm of upper-inner quadrant of left female breast: Secondary | ICD-10-CM

## 2016-06-02 NOTE — Progress Notes (Signed)
   Weekly Management Note:  outpatient    ICD-9-CM ICD-10-CM   1. Breast cancer of upper-inner quadrant of left female breast (HCC) 174.2 C50.212     Current Dose:  8.01 Gy  Projected Dose: 50.05 Gy   Narrative:  The patient presents for routine under treatment assessment.  CBCT/MVCT images/Port film x-rays were reviewed.  The chart was checked. Quite tired. Works almost full time right now  Physical Findings:  height is 5' 2"  (1.575 m) and weight is 135 lb 14.4 oz (61.6 kg). Her temperature is 97.6 F (36.4 C). Her blood pressure is 118/78 and her pulse is 80.   Wt Readings from Last 3 Encounters:  06/02/16 135 lb 14.4 oz (61.6 kg)  05/19/16 134 lb 4.8 oz (60.9 kg)  05/16/16 133 lb 14.4 oz (60.7 kg)   Slightly erythematous left breast  Impression:  The patient is tolerating radiotherapy.  Plan:  Continue radiotherapy as planned. Patient instructed to apply radiaplex to skin in treatment fields.  Filling out work related forms to support reducing hours as needed.   ________________________________   Eppie Gibson, M.D.

## 2016-06-02 NOTE — Progress Notes (Signed)
Loretta Rocha presents for her 3rd fraction of radiation to her Left Breast. She denies pain, except an occasional twinge in her Left Breast. She does report fatigue, and rests when she can. Her Left Breast has some redness over the entire breast, and to the lumpectomy site. She is using the Radiaplex cream twice daily.   BP 118/78   Pulse 80   Temp 97.6 F (36.4 C)   Ht 5' 2"  (1.575 m)   Wt 135 lb 14.4 oz (61.6 kg)   BMI 24.86 kg/m

## 2016-06-03 ENCOUNTER — Ambulatory Visit
Admission: RE | Admit: 2016-06-03 | Discharge: 2016-06-03 | Disposition: A | Discharge status: Home / Self Care | Payer: 59 | Source: Ambulatory Visit | Attending: Radiation Oncology | Admitting: Radiation Oncology

## 2016-06-03 DIAGNOSIS — C50212 Malignant neoplasm of upper-inner quadrant of left female breast: Secondary | ICD-10-CM | POA: Diagnosis not present

## 2016-06-04 ENCOUNTER — Ambulatory Visit
Admission: RE | Admit: 2016-06-04 | Discharge: 2016-06-04 | Disposition: A | Discharge status: Home / Self Care | Payer: 59 | Source: Ambulatory Visit | Attending: Radiation Oncology | Admitting: Radiation Oncology

## 2016-06-04 ENCOUNTER — Encounter: Payer: Self-pay | Admitting: Radiation Oncology

## 2016-06-04 ENCOUNTER — Other Ambulatory Visit: Payer: Self-pay | Admitting: Radiation Oncology

## 2016-06-04 ENCOUNTER — Telehealth: Payer: Self-pay

## 2016-06-04 DIAGNOSIS — R5383 Other fatigue: Secondary | ICD-10-CM

## 2016-06-04 DIAGNOSIS — C50212 Malignant neoplasm of upper-inner quadrant of left female breast: Secondary | ICD-10-CM

## 2016-06-04 LAB — HEMOGLOBIN: HEMOGLOBIN: 11.8 g/dL (ref 11.6–15.9)

## 2016-06-04 NOTE — Progress Notes (Signed)
Paperwork ([facility]um) received 8/1, given to nurse 8/2

## 2016-06-04 NOTE — Telephone Encounter (Signed)
I left a voice mail  to inform Loretta Rocha of labs that have been scheduled for her today at 4:00. I left my number to call back if she had any questions or concerns.

## 2016-06-05 ENCOUNTER — Ambulatory Visit
Admission: RE | Admit: 2016-06-05 | Discharge: 2016-06-05 | Disposition: A | Discharge status: Home / Self Care | Payer: 59 | Source: Ambulatory Visit | Attending: Radiation Oncology | Admitting: Radiation Oncology

## 2016-06-05 ENCOUNTER — Telehealth: Payer: Self-pay | Admitting: *Deleted

## 2016-06-05 ENCOUNTER — Encounter: Payer: Self-pay | Admitting: Oncology

## 2016-06-05 ENCOUNTER — Encounter: Payer: Self-pay | Admitting: Radiation Oncology

## 2016-06-05 DIAGNOSIS — C50212 Malignant neoplasm of upper-inner quadrant of left female breast: Secondary | ICD-10-CM | POA: Diagnosis not present

## 2016-06-05 LAB — TSH: TSH: 0.391 m(IU)/L (ref 0.308–3.960)

## 2016-06-05 NOTE — Progress Notes (Signed)
PAPERWORK RECEIVED FROM DOCTOR, FAXED AND CONF RECEIVED, COPY GIVEN TO PATIENT 8/3

## 2016-06-05 NOTE — Telephone Encounter (Signed)
  Oncology Nurse Navigator Documentation  Navigator Location: [facility] (06/05/16 1300) Navigator Encounter Type: Telephone (06/05/16 1300) Telephone: [place] Call (06/05/16 1300)     Surgery Date: 05/01/16 (06/05/16 1300) Treatment Initiated Date: 05/01/16 (06/05/16 1300) Patient Visit Type: RadOnc (06/05/16 1300) Treatment Phase: First Radiation Tx (06/05/16 1300)                            Time Spent with Patient: 15 (06/05/16 1300)

## 2016-06-06 ENCOUNTER — Ambulatory Visit
Admission: RE | Admit: 2016-06-06 | Discharge: 2016-06-06 | Disposition: A | Discharge status: Home / Self Care | Payer: 59 | Source: Ambulatory Visit | Attending: Radiation Oncology | Admitting: Radiation Oncology

## 2016-06-06 ENCOUNTER — Telehealth: Payer: Self-pay

## 2016-06-06 DIAGNOSIS — C50212 Malignant neoplasm of upper-inner quadrant of left female breast: Secondary | ICD-10-CM | POA: Diagnosis not present

## 2016-06-06 NOTE — Telephone Encounter (Signed)
I called and left a voice mail with Loretta Rocha informing her that her lab results did not verify any thyroid issues or anemia to explain her fatigue. Dr. Isidore Moos will see her on Monday for her weekly appointment while receiving radiation. I left my number for her to call me back with if she has any questions.

## 2016-06-09 ENCOUNTER — Telehealth: Payer: Self-pay | Admitting: Radiation Oncology

## 2016-06-09 ENCOUNTER — Ambulatory Visit
Admission: RE | Admit: 2016-06-09 | Discharge: 2016-06-09 | Disposition: A | Discharge status: Home / Self Care | Payer: 59 | Source: Ambulatory Visit | Attending: Radiation Oncology | Admitting: Radiation Oncology

## 2016-06-09 ENCOUNTER — Encounter: Payer: Self-pay | Admitting: Oncology

## 2016-06-09 ENCOUNTER — Encounter: Payer: Self-pay | Admitting: Radiation Oncology

## 2016-06-09 VITALS — BP 113/56 | HR 88 | Temp 98.2°F | Resp 16 | Ht 62.0 in | Wt 133.2 lb

## 2016-06-09 DIAGNOSIS — C50212 Malignant neoplasm of upper-inner quadrant of left female breast: Secondary | ICD-10-CM | POA: Diagnosis not present

## 2016-06-09 NOTE — Progress Notes (Signed)
Loretta Rocha presents for her 8th fraction of radiation to her Left Breast. She denies pain, except an occasional twinge pins and needles in her Left Breast, having pain all over body today.   She is having fatigue, and rests when she can. Her Left Breast has some redness over the entire breast, and to the lumpectomy site. She is using the Radiaplex cream twice daily.   She did not work today note  Given. BP (!) 113/56 (BP Location: Right Arm, Patient Position: Sitting, Cuff Size: Normal)   Pulse 88   Temp 98.2 F (36.8 C) (Oral)   Resp 16   Ht 5' 2"  (1.575 m)   Wt 133 lb 3.2 oz (60.4 kg)   SpO2 100%   BMI 24.36 kg/m

## 2016-06-09 NOTE — Progress Notes (Signed)
   Weekly Management Note:  outpatient    ICD-9-CM ICD-10-CM   1. Breast cancer of upper-inner quadrant of left female breast (HCC) 174.2 C50.212     Current Dose:  21.36 Gy  Projected Dose: 50.05 Gy   Narrative:  The patient presents for routine under treatment assessment.  CBCT/MVCT images/Port film x-rays were reviewed.  The chart was checked. Main complaint - fatigue  Physical Findings:  height is 5' 2"  (1.575 m) and weight is 133 lb 3.2 oz (60.4 kg). Her oral temperature is 98.2 F (36.8 C). Her blood pressure is 113/56 (abnormal) and her pulse is 88. Her respiration is 16 and oxygen saturation is 100%.   Wt Readings from Last 3 Encounters:  06/09/16 133 lb 3.2 oz (60.4 kg)  06/02/16 135 lb 14.4 oz (61.6 kg)  05/19/16 134 lb 4.8 oz (60.9 kg)   Slightly erythematous left breast  Impression:  The patient is tolerating radiotherapy.  Plan:  Continue radiotherapy as planned. Patient instructed to apply radiaplex to skin in treatment fields.  I  Completed work related forms to support reducing hours or taking time off as needed.   ________________________________   Eppie Gibson, M.D.

## 2016-06-09 NOTE — Telephone Encounter (Signed)
Per Dr. Pearlie Oyster order phoned patient to explain her lab work is not indicative of any anemia or thyroid concerns to explain her fatigue. Patient verbalized understanding and expressed appreciation for the call.

## 2016-06-10 ENCOUNTER — Ambulatory Visit
Admission: RE | Admit: 2016-06-10 | Discharge: 2016-06-10 | Disposition: A | Discharge status: Home / Self Care | Payer: 59 | Source: Ambulatory Visit | Attending: Radiation Oncology | Admitting: Radiation Oncology

## 2016-06-10 DIAGNOSIS — C50212 Malignant neoplasm of upper-inner quadrant of left female breast: Secondary | ICD-10-CM | POA: Diagnosis not present

## 2016-06-11 ENCOUNTER — Ambulatory Visit
Admission: RE | Admit: 2016-06-11 | Discharge: 2016-06-11 | Disposition: A | Discharge status: Home / Self Care | Payer: 59 | Source: Ambulatory Visit | Attending: Radiation Oncology | Admitting: Radiation Oncology

## 2016-06-11 DIAGNOSIS — C50212 Malignant neoplasm of upper-inner quadrant of left female breast: Secondary | ICD-10-CM | POA: Diagnosis not present

## 2016-06-12 ENCOUNTER — Encounter: Payer: Self-pay | Admitting: Radiation Oncology

## 2016-06-12 ENCOUNTER — Ambulatory Visit
Admission: RE | Admit: 2016-06-12 | Discharge: 2016-06-12 | Disposition: A | Discharge status: Home / Self Care | Payer: 59 | Source: Ambulatory Visit | Attending: Radiation Oncology | Admitting: Radiation Oncology

## 2016-06-12 DIAGNOSIS — C50212 Malignant neoplasm of upper-inner quadrant of left female breast: Secondary | ICD-10-CM | POA: Diagnosis not present

## 2016-06-12 NOTE — Progress Notes (Signed)
Paperwork (unum) received from doctor, faxed to [phone], confirmation received, copy given to patient 8/10

## 2016-06-13 ENCOUNTER — Ambulatory Visit
Admission: RE | Admit: 2016-06-13 | Discharge: 2016-06-13 | Disposition: A | Discharge status: Home / Self Care | Payer: 59 | Source: Ambulatory Visit | Attending: Radiation Oncology | Admitting: Radiation Oncology

## 2016-06-13 DIAGNOSIS — C50212 Malignant neoplasm of upper-inner quadrant of left female breast: Secondary | ICD-10-CM | POA: Diagnosis not present

## 2016-06-16 ENCOUNTER — Ambulatory Visit
Admission: RE | Admit: 2016-06-16 | Discharge: 2016-06-16 | Disposition: A | Discharge status: Home / Self Care | Payer: 59 | Source: Ambulatory Visit | Attending: Radiation Oncology | Admitting: Radiation Oncology

## 2016-06-16 ENCOUNTER — Encounter: Payer: Self-pay | Admitting: Radiation Oncology

## 2016-06-16 VITALS — BP 118/55 | HR 68 | Temp 97.6°F | Ht 62.0 in | Wt 134.7 lb

## 2016-06-16 DIAGNOSIS — C50212 Malignant neoplasm of upper-inner quadrant of left female breast: Secondary | ICD-10-CM | POA: Insufficient documentation

## 2016-06-16 MED ORDER — SONAFINE EX EMUL
1.0000 "application " | Freq: Once | CUTANEOUS | Status: AC
  Administered 2016-06-16: 1 via TOPICAL

## 2016-06-16 NOTE — Progress Notes (Signed)
   Weekly Management Note:  outpatient    ICD-9-CM ICD-10-CM   1. Breast cancer of upper-inner quadrant of left female breast (HCC) 174.2 C50.212     Current Dose:  34.71 Gy  Projected Dose: 50.05 Gy   Narrative:  The patient presents for routine under treatment assessment.  CBCT/MVCT images/Port film x-rays were reviewed.  The chart was checked. Main complaint - fatigue. Skin burns when applying radiaplex.  Physical Findings:  vitals were not taken for this visit.  Wt Readings from Last 3 Encounters:  06/16/16 134 lb 11.2 oz (61.1 kg)  06/09/16 133 lb 3.2 oz (60.4 kg)  06/02/16 135 lb 14.4 oz (61.6 kg)   Slightly erythematous left breast with dermatitis in UIQ and IM fold  Impression:  The patient is tolerating radiotherapy.  Plan:  Continue radiotherapy as planned. Patient instructed to apply sonafine to skin in treatment fields. If this burns anywhere, use aloe in sensitive areas.      ________________________________   Eppie Gibson, M.D.

## 2016-06-16 NOTE — Progress Notes (Signed)
Loretta Rocha is here for her 13th fraction of radiation to her Left Breast. She denies pain, except occasional "shooting, sharp pains" to her Left Breast. She reports fatigue, and sleeps often during the day. She is not working at this time, due to fatigue. Her Left Breast is red with areas that itch to the upper inner area and underneath her Left Breast. She reports using a lotion called Acure over her Left Breast, because the Radiaplex burns her skin. She has also used [employer]	[place] to the areas that itch. She has asked if Hydrocortisone would be alright to use over this area.  BP (!) 118/55   Pulse 68   Temp 97.6 F (36.4 C)   Ht 5' 2"  (1.575 m)   Wt 134 lb 11.2 oz (61.1 kg)   BMI 24.64 kg/m    Wt Readings from Last 3 Encounters:  06/16/16 134 lb 11.2 oz (61.1 kg)  06/09/16 133 lb 3.2 oz (60.4 kg)  06/02/16 135 lb 14.4 oz (61.6 kg)

## 2016-06-17 ENCOUNTER — Ambulatory Visit
Admission: RE | Admit: 2016-06-17 | Discharge: 2016-06-17 | Disposition: A | Discharge status: Home / Self Care | Payer: 59 | Source: Ambulatory Visit | Attending: Radiation Oncology | Admitting: Radiation Oncology

## 2016-06-17 DIAGNOSIS — C50212 Malignant neoplasm of upper-inner quadrant of left female breast: Secondary | ICD-10-CM | POA: Diagnosis not present

## 2016-06-18 ENCOUNTER — Ambulatory Visit
Admission: RE | Admit: 2016-06-18 | Discharge: 2016-06-18 | Disposition: A | Discharge status: Home / Self Care | Payer: 59 | Source: Ambulatory Visit | Attending: Radiation Oncology | Admitting: Radiation Oncology

## 2016-06-18 DIAGNOSIS — C50212 Malignant neoplasm of upper-inner quadrant of left female breast: Secondary | ICD-10-CM | POA: Diagnosis not present

## 2016-06-19 ENCOUNTER — Ambulatory Visit
Admission: RE | Admit: 2016-06-19 | Discharge: 2016-06-19 | Disposition: A | Discharge status: Home / Self Care | Payer: 59 | Source: Ambulatory Visit | Attending: Radiation Oncology | Admitting: Radiation Oncology

## 2016-06-19 DIAGNOSIS — C50212 Malignant neoplasm of upper-inner quadrant of left female breast: Secondary | ICD-10-CM | POA: Diagnosis not present

## 2016-06-20 ENCOUNTER — Ambulatory Visit
Admission: RE | Admit: 2016-06-20 | Discharge: 2016-06-20 | Disposition: A | Discharge status: Home / Self Care | Payer: 59 | Source: Ambulatory Visit | Attending: Radiation Oncology | Admitting: Radiation Oncology

## 2016-06-20 DIAGNOSIS — C50212 Malignant neoplasm of upper-inner quadrant of left female breast: Secondary | ICD-10-CM | POA: Diagnosis not present

## 2016-06-23 ENCOUNTER — Encounter: Payer: Self-pay | Admitting: Radiation Oncology

## 2016-06-23 ENCOUNTER — Ambulatory Visit
Admission: RE | Admit: 2016-06-23 | Discharge: 2016-06-23 | Disposition: A | Discharge status: Home / Self Care | Payer: 59 | Source: Ambulatory Visit | Attending: Radiation Oncology | Admitting: Radiation Oncology

## 2016-06-23 VITALS — BP 102/60 | HR 81 | Temp 97.4°F | Ht 62.0 in | Wt 134.0 lb

## 2016-06-23 DIAGNOSIS — C50212 Malignant neoplasm of upper-inner quadrant of left female breast: Secondary | ICD-10-CM | POA: Diagnosis not present

## 2016-06-23 MED ORDER — SONAFINE EX EMUL
1.0000 "application " | Freq: Once | CUTANEOUS | Status: AC
  Administered 2016-06-23: 1 via TOPICAL

## 2016-06-23 NOTE — Progress Notes (Signed)
   Weekly Management Note:  outpatient    ICD-9-CM ICD-10-CM   1. Breast cancer of upper-inner quadrant of left female breast (HCC) 174.2 C50.212 SONAFINE emulsion 1 application    Current Dose: 46.05 Gy  Projected Dose: 50.05 Gy   Narrative:  The patient presents for routine under treatment assessment.  CBCT/MVCT images/Port film x-rays were reviewed.  The chart was checked. Continued breast soreness and fatigue. Skin burning sensation.  Physical Findings:  height is 5' 2"  (1.575 m) and weight is 134 lb (60.8 kg). Her temperature is 97.4 F (36.3 C). Her blood pressure is 102/60 and her pulse is 81.   Wt Readings from Last 3 Encounters:  06/23/16 134 lb (60.8 kg)  06/16/16 134 lb 11.2 oz (61.1 kg)  06/09/16 133 lb 3.2 oz (60.4 kg)    brightly erythematous left breast with dermatitis in UIQ and IM fold  Impression:  The patient is tolerating radiotherapy.  Plan:  Continue radiotherapy as planned. Patient instructed to apply sonafine to skin in treatment fields. hydrogel pads given. 1 mo f/u card given.  ________________________________   Eppie Gibson, M.D.

## 2016-06-23 NOTE — Progress Notes (Signed)
Ms. Teat presents for her 18th fraction of radiation to her Left Breast. She reports pain a 5/10 in her Left Breast. She is using ibupofen as needed with some relief. She does admit to continued fatigue, and sleeping 13 hours at night, and napping during the day. Her Left Breast is red, and tender. She reports itching to the area to the upper inner part of her Left Breast and tells me that the sonafine works well. She was provided with a second tube of sonafine today and a one month follow up appointment.   BP 102/60   Pulse 81   Temp 97.4 F (36.3 C)   Ht 5' 2"  (1.575 m)   Wt 134 lb (60.8 kg)   BMI 24.51 kg/m

## 2016-06-24 ENCOUNTER — Encounter: Payer: Self-pay | Admitting: Radiation Oncology

## 2016-06-24 ENCOUNTER — Ambulatory Visit
Admission: RE | Admit: 2016-06-24 | Discharge: 2016-06-24 | Disposition: A | Discharge status: Home / Self Care | Payer: 59 | Source: Ambulatory Visit | Attending: Radiation Oncology | Admitting: Radiation Oncology

## 2016-06-24 DIAGNOSIS — C50212 Malignant neoplasm of upper-inner quadrant of left female breast: Secondary | ICD-10-CM | POA: Diagnosis not present

## 2016-06-24 NOTE — Progress Notes (Signed)
[facility] ([facility]) received 8/22, given to nurse

## 2016-06-25 ENCOUNTER — Ambulatory Visit
Admission: RE | Admit: 2016-06-25 | Discharge: 2016-06-25 | Disposition: A | Discharge status: Home / Self Care | Payer: 59 | Source: Ambulatory Visit | Attending: Radiation Oncology | Admitting: Radiation Oncology

## 2016-06-25 ENCOUNTER — Encounter: Payer: Self-pay | Admitting: Radiation Oncology

## 2016-06-25 DIAGNOSIS — C50212 Malignant neoplasm of upper-inner quadrant of left female breast: Secondary | ICD-10-CM | POA: Diagnosis not present

## 2016-06-26 ENCOUNTER — Telehealth: Payer: Self-pay | Admitting: *Deleted

## 2016-06-26 DIAGNOSIS — C50212 Malignant neoplasm of upper-inner quadrant of left female breast: Secondary | ICD-10-CM

## 2016-06-26 NOTE — Telephone Encounter (Signed)
  Oncology Nurse Navigator Documentation    Navigator Encounter Type: Telephone (06/26/16 1500) Telephone: Outgoing Call (06/26/16 1500)         Patient Visit Type: RadOnc (06/26/16 1500) Treatment Phase: Final Radiation Tx (06/26/16 1500) Barriers/Navigation Needs: No barriers at this time;No Questions;No Needs (06/26/16 1500)   Interventions: Referrals (06/26/16 1500) Referrals: Spiritual care;Survivorship (06/26/16 1500)          Acuity: Level 1 (06/26/16 1500)         Time Spent with Patient: 15 (06/26/16 1500)

## 2016-06-29 ENCOUNTER — Telehealth: Payer: Self-pay | Admitting: Oncology

## 2016-06-29 NOTE — Telephone Encounter (Signed)
Lvm advising appt 11/15 @ 10.30am. Also mailed appt calendar.

## 2016-06-30 ENCOUNTER — Encounter: Payer: Self-pay | Admitting: Radiation Oncology

## 2016-06-30 NOTE — Progress Notes (Signed)
Paperwork received from doctor 8/28, faxed to [facility] @ [phone], conf received 8/28, copy given to patient

## 2016-07-11 NOTE — Progress Notes (Signed)
  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 Breast cancer of upper-inner quadrant of left female breast ([place]) 174.2 C50.212    Indication for treatment: Curative       Radiation treatment dates:   05/29/2016-06/25/2016  Site/dose: 1) Left breast: 40.05 in 15 fractions   2) Left breast boost: 10 Gy in 5 fractions  Beams/energy: 1) 3D // 10X, 6X Photon    2) Isodose Plan // 15X, 6X Photon  Narrative: The patient tolerated radiation treatment relatively well. The patient had continued breast soreness and fatigue. Towards the end of treatment, she had a brightly erythematous left breast with dermatitis in UIQ and IM fold. The patient was instructed to apply sonafine to the skin in the treatment fields and hydrogel pads were given.  Plan: The patient has completed radiation treatment. The patient will return to radiation oncology clinic for routine followup in one month. I advised them to call or return sooner if they have any questions or concerns related to their recovery or treatment.  -----------------------------------  Eppie Gibson, MD  This document serves as a record of services personally performed by Eppie Gibson, MD. It was created on her behalf by Darcus Austin, a trained medical scribe. The creation of this record is based on the scribe's personal observations and the provider's statements to them. This document has been checked and approved by the attending provider.

## 2016-07-15 ENCOUNTER — Encounter: Payer: Self-pay | Admitting: Radiation Oncology

## 2016-07-22 ENCOUNTER — Ambulatory Visit (HOSPITAL_BASED_OUTPATIENT_CLINIC_OR_DEPARTMENT_OTHER): Payer: 59 | Admitting: Oncology

## 2016-07-22 VITALS — BP 119/65 | HR 90 | Temp 97.7°F | Resp 18 | Ht 62.0 in | Wt 137.6 lb

## 2016-07-22 DIAGNOSIS — Z79811 Long term (current) use of aromatase inhibitors: Secondary | ICD-10-CM

## 2016-07-22 DIAGNOSIS — C50212 Malignant neoplasm of upper-inner quadrant of left female breast: Secondary | ICD-10-CM

## 2016-07-22 DIAGNOSIS — Z170 Estrogen receptor positive status [ER+]: Secondary | ICD-10-CM

## 2016-07-22 MED ORDER — GABAPENTIN 300 MG PO CAPS: 300.0000 mg | ORAL_CAPSULE | Freq: Every day | ORAL | 4 refills | Status: DC

## 2016-07-22 MED ORDER — CLONIDINE HCL 0.1 MG/24HR TD PTWK: 0.1000 mg | MEDICATED_PATCH | TRANSDERMAL | 12 refills | Status: DC

## 2016-07-22 MED ORDER — ANASTROZOLE 1 MG PO TABS: 1.0000 mg | ORAL_TABLET | Freq: Every day | ORAL | 4 refills | Status: DC

## 2016-07-22 NOTE — Progress Notes (Signed)
[place]  [phone] [phone]) [phone]     ID: Loretta Rocha DOB: 29-Nov-1955  MR#: [id number]  RWP#:[id number]  Patient Care Team: Jani Gravel, MD as PCP - General (Internal Medicine) Chauncey Cruel, MD as Consulting Physician (Oncology) Fanny Skates, MD as Consulting Physician (General Surgery) Laurence Spates, MD as Consulting Physician (Gastroenterology) PCP: Jani Gravel, MD GYN: SU:  OTHER MD:  CHIEF COMPLAINT: Estrogen receptor positive breast cancer   CURRENT TREATMENT: Anastrozole  BREAST CANCER HISTORY: From the original intake note:  Kea had routine screening mammography with tomography at the [facility] 04/16/2016. This found a possible distortion in the left breast. On 04/17/2016 she underwent diagnostic left mammography with tomography and ultrasonography. The breast density was category C. In the upper inner left breast there was a 0.7 cm obscured mass. A biopsy clip was in the inferior part of the breast. Physical exam found no palpable mass. Ultrasonography confirmed a 0.9 cm irregular echogenic mass at the 12:00 position 5 cm from the nipple, and a second mass at the 10:00 position 4 cm from the nipple measuring 0.7 cm. There was no left axillary adenopathy.  Biopsy of both masses was performed 04/21/2016. The more superior mass, measuring 0.9 cm on ultrasound, was invasive ductal carcinoma, grade 1, estrogen receptor 100% positive, progesterone receptor 100% positive, with strong staining intensity, with an MIB-1 of 15%, and no HER-2 amplification, the signals ratio being 1.79, and the number per cell 1.50 ([id number]). The second mass, at 10:00, showed fibrosis with no evidence of malignancy.  Her subsequent history is as detailed below  INTERVAL HISTORY: Zaakirah returns today for follow-up of her breast cancer. Since her last visit here she completed her radiation treatments. She generally did well without, although "it was tough  towards the end". She had significant fatigue and had to take some weeks off work. She went back to work yesterday. She finds it demanding but doable. She is now ready to discuss anti-estrogen therapy  REVIEW OF SYSTEMS: Palma of course have to go off hormone replacement and is having significant problems with hot flashes. They keep her up at night and bother her during the day. She has baseline fibromyalgia issues which are not worse than before. Occasionally she has palpitations. She feels anxious, but not depressed. She sometimes forgetful and wonders about cognitive issues relating to her work. Aside from these problems the detailed review of systems today was stable   PAST MEDICAL HISTORY: Past Medical History:  Diagnosis Date  . 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 Breast cancer of upper-inner quadrant of left female breast ([place])   . Fibromyalgia   . IBS (irritable bowel syndrome)     PAST SURGICAL HISTORY: Past Surgical History:  Procedure Laterality Date  . ABDOMINAL HYSTERECTOMY    . BRAIN SURGERY  1982   has Omya reservoir placed after treatment for leukemia  . PARTIAL HYSTERECTOMY    . RADIOACTIVE SEED GUIDED MASTECTOMY WITH AXILLARY SENTINEL LYMPH NODE BIOPSY Left 05/01/2016   Procedure: RADIOACTIVE SEED GUIDED LEFT BREAST LUMPECTOMY WITH AXILLARY SENTINEL LYMPH NODE BIOPSY;  Surgeon: Fanny Skates, MD;  Location: [facility];  Service: General;  Laterality: Left;    FAMILY HISTORY No family history on file. The patient's father died at age 45 with a history of lung cancer in the setting of tobacco abuse. The patient's mother is 59 years old as of June 2017. The patient has 2 brothers, no sisters. There is a  history of prostate cancer on the maternal side. There is no history of breast or ovarian cancer in the family.  GYNECOLOGIC HISTORY:  No LMP recorded. Patient has had a hysterectomy. Menarche age 60, first live birth age 60. She is GX P2. The  patient underwent simple hysterectomy without salpingo-oophorectomy for endometriosis in 1994. She has been on hormone replacement approximately 6 years, stopping in June 2017.  SOCIAL HISTORY:  Sherran works as a Psychologist, sport and exercise for [employer]. She is divorced and lives alone with her terrier-yorkie mix and a cat. Daughter [name] lives in [place] where she works as a Education officer, museum. Daughter [name]  lives in [place] where she works as a Education administrator, hoping to go to [employer] school. The patient has a step daughter from her earlier marriage [name], who lives in [place]. [name] has one child and one on the way.     ADVANCED DIRECTIVES: Not in place   HEALTH MAINTENANCE: Social History  Substance Use Topics  . Smoking status: Former Smoker    Quit date: 04/27/1993  . Smokeless tobacco: Never Used  . Alcohol use 1.2 oz/week    2 Standard drinks or equivalent per week     Comment: social     Colonoscopy: October 2016/  PAP: Status post hysterectomy  Bone density: 2012?  Lipid panel:  Allergies  Allergen Reactions  . Morphine And Related Nausea Only  . Chlorhexidine Gluconate Rash  . Penicillins Rash    Received 2 GM Ancef with no obvious reaction.     Current Outpatient Prescriptions  Medication Sig Dispense Refill  . ALPRAZolam (XANAX) 0.5 MG tablet Take 0.5 mg by mouth at bedtime as needed for anxiety (1/2 tab prn).    Marland Kitchen anastrozole (ARIMIDEX) 1 MG tablet Take 1 tablet (1 mg total) by mouth daily. 90 tablet 4  . b complex vitamins capsule Take 1 capsule by mouth daily.    . carisoprodol (SOMA) 350 MG tablet Take 350 mg by mouth 2 (two) times daily as needed.     . cloNIDine (CATAPRES-TTS-1) 0.1 mg/24hr patch Place 1 patch (0.1 mg total) onto the skin once a week. 4 patch 12  . DULoxetine (CYMBALTA) 60 MG capsule Take 60 mg by mouth daily.     Marland Kitchen gabapentin (NEURONTIN) 300 MG capsule Take 1 capsule (300 mg total)  by mouth at bedtime. 90 capsule 4  . KRILL OIL PO Take 1 capsule by mouth daily.     . magnesium hydroxide (MILK OF MAGNESIA) 400 MG/5ML suspension Take 30 mLs by mouth daily as needed for mild constipation.    . Multiple Vitamin (MULTIVITAMIN WITH MINERALS) TABS tablet Take 1 tablet by mouth daily.    . Red Yeast Rice Extract (RED YEAST RICE PO) Take 1 tablet by mouth 2 (two) times daily.     . traMADol (ULTRAM) 50 MG tablet Take 50 mg by mouth every 6 (six) hours as needed.    . TURMERIC CURCUMIN PO Take 1 capsule by mouth daily.     Marland Kitchen VITAMIN D, CHOLECALCIFEROL, PO Take 5,000 Units by mouth.     No current facility-administered medications for this visit.     OBJECTIVE: Middle-aged white woman In no acute distress Vitals:   07/22/16 1549  BP: 119/65  Pulse: 90  Resp: 18  Temp: 97.7 F (36.5 C)     Body mass index is 25.17 kg/m.    ECOG FS:1 - Symptomatic but completely ambulatory  Sclerae unicteric, EOMs intact Oropharynx clear and moist No cervical or supraclavicular adenopathy Lungs no rales or rhonchi Heart regular rate and rhythm Abd soft, nontender, positive bowel sounds MSK no focal spinal tenderness, no upper extremity lymphedema Neuro: nonfocal, well oriented, appropriate affect Breasts: The right breast is unremarkable. The left breast is status post lumpectomy and radiation. There is still minimal erythema over the boost area. The cosmetic result is excellent. There is no evidence of local recurrence. The left axilla is benign.    LAB RESULTS:  CMP     Component Value Date/Time   NA 137 04/25/2016 1540   K 4.2 04/25/2016 1540   CO2 29 04/25/2016 1540   GLUCOSE 112 04/25/2016 1540   BUN 17.0 04/25/2016 1540   CREATININE 0.8 04/25/2016 1540   CALCIUM 9.6 04/25/2016 1540   PROT 7.8 04/25/2016 1540   ALBUMIN 4.0 04/25/2016 1540   AST 28 04/25/2016 1540   ALT 35 04/25/2016 1540   ALKPHOS 54 04/25/2016 1540   BILITOT <0.30 04/25/2016 1540    INo results  found for: SPEP, UPEP  Lab Results  Component Value Date   WBC 11.4 (H) 04/25/2016   NEUTROABS 8.2 (H) 04/25/2016   HGB 11.8 06/04/2016   HCT 34.3 (L) 04/25/2016   MCV 90.0 04/25/2016   PLT 361 04/25/2016      Chemistry      Component Value Date/Time   NA 137 04/25/2016 1540   K 4.2 04/25/2016 1540   CO2 29 04/25/2016 1540   BUN 17.0 04/25/2016 1540   CREATININE 0.8 04/25/2016 1540      Component Value Date/Time   CALCIUM 9.6 04/25/2016 1540   ALKPHOS 54 04/25/2016 1540   AST 28 04/25/2016 1540   ALT 35 04/25/2016 1540   BILITOT <0.30 04/25/2016 1540       No results found for: LABCA2  No components found for: LABCA125  No results for input(s): INR in the last 168 hours.  Urinalysis No results found for: COLORURINE, APPEARANCEUR, LABSPEC, PHURINE, GLUCOSEU, HGBUR, BILIRUBINUR, KETONESUR, PROTEINUR, UROBILINOGEN, NITRITE, LEUKOCYTESUR      STUDIES: No results found.  ELIGIBLE FOR AVAILABLE RESEARCH PROTOCOL: no  ASSESSMENT: 60 y.o. [place] woman status post left breast upper inner quadrant biopsy 04/21/2016 for a clinical T1b N0, stage IA invasive ductal carcinoma, strongly estrogen and progesterone receptor positive, HER-2 nonamplified, with an MIB-1 of 15%.   (1) status post left lumpectomy and sentinel lymph node sampling 05/01/2016 for a pT1c pN0, stage IA invasive ductal carcinoma, grade 1, with negative margins, and repeat HER-2 again negative  (2) Oncotype DX t score of 10 predicts a 10 year risk of recurrence outside the breast of 7% if the patient's only systemic therapy is tamoxifen for 5 years. It also predicts no benefit from adjuvant chemotherapy.  (3) adjuvant radiation 05/29/2016-06/25/2016  1) Left breast: 40.05 in 15 fractions  2) Left breast boost: 10 Gy in 5 fractions  (4)  to start anastrozole 08/03/2016   PLAN:  Martia has completed local therapy for breast cancer. She is now ready to contemplate systemic treatment, which in her case  means anti-estrogens.  We discussed the difference between tamoxifen and anastrozole and I think anastrozole would be a better drug for her because it would give her an additional 2-3% risk reduction.  She understands the possible toxicities side effects and complications of this agent including concerns with bone density. She will have a bone density scan before seeing me again.  She is really  having terrific problems from hot flashes because of going off the estrogen replacement. I think we need to deal with that first. I am writing her for gabapentin to take at bedtime and TTS 1 patches to use as directed. This should make a significant difference to her problems with hot flashes.  She can start her anastrozole October 1. She will see me approximately 6 weeks later. If she tolerates it well and we will continue that drug for at least 2 years before considering switching to tamoxifen. Otherwise we will switch to a different aromatase inhibitor.  She is concerned regarding "chemo brain" another cognitive issues. My suggestion there is of course to avoid any drugs that might compromise her cognitive abilities but also to try to work to decrease stress any way possible. She should concentrate on focusing on putting problems out of her mind while she is doing her work, which is complex and demanding  She knows to call for any problems that may develop before her next visit   Chauncey Cruel, MD   07/22/2016 4:41 PM Medical Oncology and Hematology [facility] [place], [place] [address] Tel. [phone]    Fax. [phone]

## 2016-07-23 ENCOUNTER — Other Ambulatory Visit: Payer: Self-pay | Admitting: *Deleted

## 2016-07-23 NOTE — Telephone Encounter (Signed)
Prescriptions for catapres, gabapentin, and arimidex faxed to Kristopher Oppenheim at [facility].

## 2016-07-30 ENCOUNTER — Encounter: Payer: Self-pay | Admitting: Radiation Oncology

## 2016-08-01 ENCOUNTER — Ambulatory Visit
Admission: RE | Admit: 2016-08-01 | Discharge: 2016-08-01 | Disposition: A | Discharge status: Home / Self Care | Payer: 59 | Source: Ambulatory Visit | Attending: Radiation Oncology | Admitting: Radiation Oncology

## 2016-08-01 ENCOUNTER — Encounter: Payer: Self-pay | Admitting: Radiation Oncology

## 2016-08-01 VITALS — BP 118/57 | HR 76 | Temp 97.9°F | Ht 62.0 in | Wt 138.2 lb

## 2016-08-01 DIAGNOSIS — Z885 Allergy status to narcotic agent status: Secondary | ICD-10-CM | POA: Diagnosis not present

## 2016-08-01 DIAGNOSIS — C50212 Malignant neoplasm of upper-inner quadrant of left female breast: Secondary | ICD-10-CM | POA: Diagnosis present

## 2016-08-01 DIAGNOSIS — Z923 Personal history of irradiation: Secondary | ICD-10-CM | POA: Diagnosis not present

## 2016-08-01 DIAGNOSIS — Z888 Allergy status to other drugs, medicaments and biological substances status: Secondary | ICD-10-CM | POA: Insufficient documentation

## 2016-08-01 DIAGNOSIS — Z880 Allergy status to penicillin: Secondary | ICD-10-CM | POA: Insufficient documentation

## 2016-08-01 HISTORY — DX: Personal history of irradiation: Z92.3

## 2016-08-01 NOTE — Progress Notes (Signed)
Radiation Oncology         [phone] ________________________________  Name: Loretta Rocha MRN: [id number]  Date: 08/01/2016  DOB: [birth year]  Follow-Up Visit Note  Outpatient  CC: Jani Gravel, MD  Jani Gravel, MD  Diagnosis and Prior Radiotherapy:    ICD-9-CM ICD-10-CM   1. Breast cancer of upper-inner quadrant of left female breast (HCC) 174.2 C50.212     T1b N0, stage IA invasive ductal carcinoma of the left breast  Narrative:  The patient returns today for routine follow-up.  She reports occasional pain to her left breast several times a day. She reports improvement in her fatigue. She is back working full time. She started Anastrozole today. She has a survivorship appointment on 09/17/16. She has no other concerns at this time.  She has stopped using the Radiaplex and she is using Acure cream to her left breast at this time.                             ALLERGIES:  is allergic to lyrica [pregabalin]; morphine and related; chlorhexidine gluconate; and penicillins.  Meds: Current Outpatient Prescriptions  Medication Sig Dispense Refill  . ALPRAZolam (XANAX) 0.5 MG tablet Take 0.5 mg by mouth at bedtime as needed for anxiety (1/2 tab prn).    Marland Kitchen anastrozole (ARIMIDEX) 1 MG tablet Take 1 tablet (1 mg total) by mouth daily. 90 tablet 4  . b complex vitamins capsule Take 1 capsule by mouth daily.    . butalbital-acetaminophen-caffeine (FIORICET, ESGIC) 50-325-40 MG tablet Frequency:Every twelve hours   Dosage:0   MG  Instructions:  Note:TAKE 1 TABLET BY MOUTH EVERY 12 HOURS AS NEEDED FOR HEADACHE    . carisoprodol (SOMA) 350 MG tablet Take 350 mg by mouth 2 (two) times daily as needed.     . cloNIDine (CATAPRES-TTS-1) 0.1 mg/24hr patch Place 1 patch (0.1 mg total) onto the skin once a week. 4 patch 12  . DULoxetine (CYMBALTA) 60 MG capsule Take 60 mg by mouth daily.     Marland Kitchen gabapentin (NEURONTIN) 300 MG capsule Take 1 capsule (300 mg total) by mouth at bedtime. 90 capsule 4  . ibuprofen  (ADVIL,MOTRIN) 200 MG tablet Take 200 mg by mouth every 6 (six) hours as needed.    . magnesium hydroxide (MILK OF MAGNESIA) 400 MG/5ML suspension Take 30 mLs by mouth daily as needed for mild constipation.    . Multiple Vitamin (MULTIVITAMIN WITH MINERALS) TABS tablet Take 1 tablet by mouth daily.    . Red Yeast Rice Extract (RED YEAST RICE PO) Take 1 tablet by mouth 2 (two) times daily.     . TURMERIC CURCUMIN PO Take 1 capsule by mouth daily.     Marland Kitchen VITAMIN D, CHOLECALCIFEROL, PO Take 5,000 Units by mouth.    Marland Kitchen HYDROcodone-acetaminophen (NORCO/VICODIN) 5-325 MG tablet     . traMADol (ULTRAM) 50 MG tablet Take 50 mg by mouth every 6 (six) hours as needed.     No current facility-administered medications for this encounter.     Physical Findings: The patient is in no acute distress. Patient is alert and oriented.  height is 5' 2"  (1.575 m) and weight is 138 lb 3.2 oz (62.7 kg). Her temperature is 97.9 F (36.6 C). Her blood pressure is 118/57 (abnormal) and her pulse is 76. Her oxygen saturation is 98%. .    Faint residual erythema over the left breast  Lab Findings: Lab Results  Component Value  Date   WBC 11.4 (H) 04/25/2016   HGB 11.8 06/04/2016   HCT 34.3 (L) 04/25/2016   MCV 90.0 04/25/2016   PLT 361 04/25/2016    Radiographic Findings: No results found.  Impression/Plan:  She is healing well. I encouraged her to continue with yearly mammography and followup with medical oncology. I will see her back on an as-needed basis. I have encouraged her to call if she has any issues or concerns in the future. I wished her the very best.   _____________________________________   Eppie Gibson, MD This document serves as a record of services personally performed by Eppie Gibson, MD. It was created on her behalf by Bethann Humble, a trained medical scribe. The creation of this record is based on the scribe's personal observations and the provider's statements to them. This document has been  checked and approved by the attending provider.

## 2016-08-01 NOTE — Progress Notes (Signed)
Ms. Kaplan is here for follow up of radiation completed 06/25/16 to her Left Breast. She report occasional pain to her Left Breast several times a day. She reports improvement in her fatigue. She is back working full time. She started Anastrozole today. She has a survivorship appointment on 09/17/16. She has no other concerns at this time. Her skin is still red with some hyperpigmentation present. She has stopped using the Radiaplex and she is using Acure cream to her Left Breast at this time.   BP (!) 118/57   Pulse 76   Temp 97.9 F (36.6 C)   Ht 5' 2"  (1.575 m)   Wt 138 lb 3.2 oz (62.7 kg)   SpO2 98% Comment: room air  BMI 25.28 kg/m    Wt Readings from Last 3 Encounters:  08/01/16 138 lb 3.2 oz (62.7 kg)  07/22/16 137 lb 9.6 oz (62.4 kg)  06/23/16 134 lb (60.8 kg)

## 2016-08-01 NOTE — Addendum Note (Signed)
Encounter addended by: Ernst Spell, RN on: 08/01/2016  4:16 PM<BR>    Actions taken: Charge Capture section accepted

## 2016-08-06 ENCOUNTER — Telehealth: Payer: Self-pay | Admitting: Oncology

## 2016-08-06 NOTE — Telephone Encounter (Signed)
11/15 Appointment canceled per patent request. The patient will call to reschedule at a later time.

## 2016-08-15 ENCOUNTER — Other Ambulatory Visit: Payer: Self-pay | Admitting: Oncology

## 2016-08-15 MED ORDER — GABAPENTIN 300 MG PO CAPS: 300.0000 mg | ORAL_CAPSULE | Freq: Three times a day (TID) | ORAL | 4 refills | Status: DC | PRN

## 2016-09-10 ENCOUNTER — Other Ambulatory Visit: Payer: 59

## 2016-09-10 ENCOUNTER — Ambulatory Visit
Admission: RE | Admit: 2016-09-10 | Discharge: 2016-09-10 | Disposition: A | Discharge status: Home / Self Care | Payer: 59 | Source: Ambulatory Visit | Attending: Oncology | Admitting: Oncology

## 2016-09-10 DIAGNOSIS — C50212 Malignant neoplasm of upper-inner quadrant of left female breast: Secondary | ICD-10-CM

## 2016-09-17 ENCOUNTER — Encounter: Payer: 59 | Admitting: Adult Health

## 2016-09-24 ENCOUNTER — Other Ambulatory Visit: Payer: Self-pay | Admitting: Internal Medicine

## 2016-09-24 ENCOUNTER — Ambulatory Visit: Payer: 59 | Admitting: Oncology

## 2016-09-24 ENCOUNTER — Other Ambulatory Visit: Payer: 59

## 2016-09-24 DIAGNOSIS — R102 Pelvic and perineal pain: Secondary | ICD-10-CM

## 2016-10-08 ENCOUNTER — Ambulatory Visit
Admission: RE | Admit: 2016-10-08 | Discharge: 2016-10-08 | Disposition: A | Discharge status: Home / Self Care | Payer: 59 | Source: Ambulatory Visit | Attending: Internal Medicine | Admitting: Internal Medicine

## 2016-10-08 DIAGNOSIS — R102 Pelvic and perineal pain: Secondary | ICD-10-CM

## 2016-10-09 ENCOUNTER — Other Ambulatory Visit: Payer: Self-pay | Admitting: *Deleted

## 2016-10-09 DIAGNOSIS — C50212 Malignant neoplasm of upper-inner quadrant of left female breast: Secondary | ICD-10-CM

## 2016-10-10 ENCOUNTER — Ambulatory Visit (HOSPITAL_BASED_OUTPATIENT_CLINIC_OR_DEPARTMENT_OTHER): Payer: 59 | Admitting: Oncology

## 2016-10-10 ENCOUNTER — Other Ambulatory Visit (HOSPITAL_BASED_OUTPATIENT_CLINIC_OR_DEPARTMENT_OTHER): Payer: 59

## 2016-10-10 VITALS — BP 111/60 | HR 87 | Temp 97.5°F | Resp 18 | Ht 62.0 in | Wt 136.6 lb

## 2016-10-10 DIAGNOSIS — M791 Myalgia: Secondary | ICD-10-CM | POA: Diagnosis not present

## 2016-10-10 DIAGNOSIS — C50212 Malignant neoplasm of upper-inner quadrant of left female breast: Secondary | ICD-10-CM | POA: Diagnosis not present

## 2016-10-10 DIAGNOSIS — N951 Menopausal and female climacteric states: Secondary | ICD-10-CM | POA: Diagnosis not present

## 2016-10-10 DIAGNOSIS — Z170 Estrogen receptor positive status [ER+]: Secondary | ICD-10-CM

## 2016-10-10 DIAGNOSIS — Z79811 Long term (current) use of aromatase inhibitors: Secondary | ICD-10-CM

## 2016-10-10 LAB — COMPREHENSIVE METABOLIC PANEL
ALK PHOS: 88 U/L (ref 40–150)
ALT: 69 U/L — ABNORMAL HIGH (ref 0–55)
ANION GAP: 10 meq/L (ref 3–11)
AST: 45 U/L — ABNORMAL HIGH (ref 5–34)
Albumin: 3.9 g/dL (ref 3.5–5.0)
BILIRUBIN TOTAL: 0.3 mg/dL (ref 0.20–1.20)
BUN: 8 mg/dL (ref 7.0–26.0)
CO2: 29 meq/L (ref 22–29)
Calcium: 9.9 mg/dL (ref 8.4–10.4)
Chloride: 100 mEq/L (ref 98–109)
Creatinine: 0.8 mg/dL (ref 0.6–1.1)
EGFR: 84 mL/min/{1.73_m2} — AB (ref >90)
Glucose: 94 mg/dl (ref 70–140)
Potassium: 4 mEq/L (ref 3.5–5.1)
Sodium: 139 mEq/L (ref 136–145)
TOTAL PROTEIN: 7.7 g/dL (ref 6.4–8.3)

## 2016-10-10 LAB — CBC WITH DIFFERENTIAL/PLATELET
BASO%: 1.3 % (ref 0.0–2.0)
BASOS ABS: 0.1 10*3/uL (ref 0.0–0.1)
EOS ABS: 0.1 10*3/uL (ref 0.0–0.5)
EOS%: 2.4 % (ref 0.0–7.0)
HCT: 37.6 % (ref 34.8–46.6)
HGB: 12.2 g/dL (ref 11.6–15.9)
LYMPH%: 21.6 % (ref 14.0–49.7)
MCH: 29.5 pg (ref 25.1–34.0)
MCHC: 32.4 g/dL (ref 31.5–36.0)
MCV: 91 fL (ref 79.5–101.0)
MONO#: 0.9 10*3/uL (ref 0.1–0.9)
MONO%: 14.6 % — ABNORMAL HIGH (ref 0.0–14.0)
NEUT%: 60.1 % (ref 38.4–76.8)
NEUTROS ABS: 3.7 10*3/uL (ref 1.5–6.5)
PLATELETS: 409 10*3/uL — AB (ref 145–400)
RBC: 4.14 10*6/uL (ref 3.70–5.45)
RDW: 13.5 % (ref 11.2–14.5)
WBC: 6.2 10*3/uL (ref 3.9–10.3)
lymph#: 1.3 10*3/uL (ref 0.9–3.3)

## 2016-10-10 NOTE — Progress Notes (Signed)
[facility]  [phone] [phone]     ID: Loretta Rocha DOB: [birth year]  MR#: [id number]  DXA#:[id number]  Patient Care Team: Jani Gravel, MD as PCP - General (Internal Medicine) Chauncey Cruel, MD as Consulting Physician (Oncology) Fanny Skates, MD as Consulting Physician (General Surgery) Laurence Spates, MD as Consulting Physician (Gastroenterology) PCP: Jani Gravel, MD OTHER MD:  CHIEF COMPLAINT: Estrogen receptor positive breast cancer   CURRENT TREATMENT: Anastrozole  BREAST CANCER HISTORY: From the original intake note:  [name] had routine screening mammography with tomography at the [facility] 04/16/2016. This found a possible distortion in the left breast. On 04/17/2016 she underwent diagnostic left mammography with tomography and ultrasonography. The breast density was category C. In the upper inner left breast there was a 0.7 cm obscured mass. A biopsy clip was in the inferior part of the breast. Physical exam found no palpable mass. Ultrasonography confirmed a 0.9 cm irregular echogenic mass at the 12:00 position 5 cm from the nipple, and a second mass at the 10:00 position 4 cm from the nipple measuring 0.7 cm. There was no left axillary adenopathy.  Biopsy of both masses was performed 04/21/2016. The more superior mass, measuring 0.9 cm on ultrasound, was invasive ductal carcinoma, grade 1, estrogen receptor 100% positive, progesterone receptor 100% positive, with strong staining intensity, with an MIB-1 of 15%, and no HER-2 amplification, the signals ratio being 1.79, and the number per cell 1.50 ([id number]). The second mass, at 10:00, showed fibrosis with no evidence of malignancy.  Her subsequent history is as detailed below  INTERVAL HISTORY: Valena returns today for follow-up of her estrogen receptor positive breast cancer. After completing her radiation treatments she started anastrozole, the first week in October. She is  tolerating it well. The nighttime hot flashes in particular are greatly diminished with gabapentin and she is taking that 3 times a day without somnolence. We added a TTS 1 patch for the hot flashes but unfortunately she developed a rash with it and has discontinued it. She is obtaining anastrozole currently at no cost  REVIEW OF SYSTEMS: [name] is still adjusting to not taking estrogen, which was a very abrupt change for her. She complains of fatigue and hurting all over, but she tells me her fibromyalgia symptoms are really know worsen before. She has discomfort in the surgical breast, including occasional stabbing pains and some sensitivity. She is very forgetful. A detailed review of systems was otherwise stable  PAST MEDICAL HISTORY: Past Medical History:  Diagnosis Date  . 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 Breast cancer of upper-inner quadrant of left female breast ([place])   . Fibromyalgia   . History of radiation therapy 05/29/16- 06/25/2016   Left Breast  . IBS (irritable bowel syndrome)     PAST SURGICAL HISTORY: Past Surgical History:  Procedure Laterality Date  . ABDOMINAL HYSTERECTOMY    . BRAIN SURGERY  1982   has Omya reservoir placed after treatment for leukemia  . PARTIAL HYSTERECTOMY    . RADIOACTIVE SEED GUIDED MASTECTOMY WITH AXILLARY SENTINEL LYMPH NODE BIOPSY Left 05/01/2016   Procedure: RADIOACTIVE SEED GUIDED LEFT BREAST LUMPECTOMY WITH AXILLARY SENTINEL LYMPH NODE BIOPSY;  Surgeon: Fanny Skates, MD;  Location: [facility];  Service: General;  Laterality: Left;    FAMILY HISTORY No family history on file. The patient's father died at age 10 with a history of lung cancer in the setting of tobacco abuse. The patient's mother is 59 years  old as of June 2017. The patient has 2 brothers, no sisters. There is a history of prostate cancer on the maternal side. There is no history of breast or ovarian cancer in the family.  GYNECOLOGIC HISTORY:    No LMP recorded. Patient has had a hysterectomy. Menarche age 49, first live birth age 89. She is GX P2. The patient underwent simple hysterectomy without salpingo-oophorectomy for endometriosis in 1994. She has been on hormone replacement approximately 6 years, stopping in June 2017.  SOCIAL HISTORY:  Hadiyah works as a Psychologist, sport and exercise for [employer]. She is divorced and lives alone with her terrier-yorkie mix and a cat. Daughter [name] lives in [place] where she works as a Education officer, museum. Daughter [name]  lives in [place] where she works as a Education administrator, hoping to go to [employer] school. The patient has a step daughter from her earlier marriage [name], who lives in [place]. [name] has one child and one on the way.     ADVANCED DIRECTIVES: Not in place   HEALTH MAINTENANCE: Social History  Substance Use Topics  . Smoking status: Former Smoker    Quit date: 04/27/1993  . Smokeless tobacco: Never Used  . Alcohol use 1.2 oz/week    2 Standard drinks or equivalent per week     Comment: social     Colonoscopy: October 2016/  PAP: Status post hysterectomy  Bone density: 2012?  Lipid panel:  Allergies  Allergen Reactions  . Lyrica [Pregabalin] Swelling    Swelling to hands and feet  . Morphine And Related Nausea Only  . Chlorhexidine Gluconate Rash  . Penicillins Rash    Received 2 GM Ancef with no obvious reaction.     Current Outpatient Prescriptions  Medication Sig Dispense Refill  . ALPRAZolam (XANAX) 0.5 MG tablet Take 0.5 mg by mouth at bedtime as needed for anxiety (1/2 tab prn).    Marland Kitchen anastrozole (ARIMIDEX) 1 MG tablet Take 1 tablet (1 mg total) by mouth daily. 90 tablet 4  . b complex vitamins capsule Take 1 capsule by mouth daily.    . butalbital-acetaminophen-caffeine (FIORICET, ESGIC) 50-325-40 MG tablet Frequency:Every twelve hours   Dosage:0   MG  Instructions:  Note:TAKE 1 TABLET BY MOUTH EVERY 12  HOURS AS NEEDED FOR HEADACHE    . carisoprodol (SOMA) 350 MG tablet Take 350 mg by mouth 2 (two) times daily as needed.     . DULoxetine (CYMBALTA) 60 MG capsule Take 60 mg by mouth daily.     Marland Kitchen gabapentin (NEURONTIN) 300 MG capsule Take 1 capsule (300 mg total) by mouth 3 (three) times daily as needed. 90 capsule 4  . HYDROcodone-acetaminophen (NORCO/VICODIN) 5-325 MG tablet     . ibuprofen (ADVIL,MOTRIN) 200 MG tablet Take 200 mg by mouth every 6 (six) hours as needed.    . magnesium hydroxide (MILK OF MAGNESIA) 400 MG/5ML suspension Take 30 mLs by mouth daily as needed for mild constipation.    . Multiple Vitamin (MULTIVITAMIN WITH MINERALS) TABS tablet Take 1 tablet by mouth daily.    . Red Yeast Rice Extract (RED YEAST RICE PO) Take 1 tablet by mouth 2 (two) times daily.     . traMADol (ULTRAM) 50 MG tablet Take 50 mg by mouth every 6 (six) hours as needed.    . TURMERIC CURCUMIN PO Take 1 capsule by mouth daily.     Marland Kitchen VITAMIN D, CHOLECALCIFEROL, PO Take 5,000 Units by mouth.  No current facility-administered medications for this visit.     OBJECTIVE: Middle-aged white woman Who appears stated age 60:   10/10/16 1546  BP: 111/60  Pulse: 87  Resp: 18  Temp: 97.5 F (36.4 C)     Body mass index is 24.98 kg/m.    ECOG FS:1 - Symptomatic but completely ambulatory  Sclerae unicteric, pupils round and equal Oropharynx clear and moist-- no thrush or other lesions No cervical or supraclavicular adenopathy Lungs no rales or rhonchi Heart regular rate and rhythm Abd soft, nontender, positive bowel sounds MSK no focal spinal tenderness, no upper extremity lymphedema Neuro: nonfocal, well oriented, appropriate affect Breasts: The right breast is benign. The left breast is status post lumpectomy and radiation. The cosmetic result is excellent. The erythema from the radiation is completely resolved. There is no evidence of local recurrence. The left axilla is benign.  LAB  RESULTS:  CMP     Component Value Date/Time   NA 139 10/10/2016 1505   K 4.0 10/10/2016 1505   CO2 29 10/10/2016 1505   GLUCOSE 94 10/10/2016 1505   BUN 8.0 10/10/2016 1505   CREATININE 0.8 10/10/2016 1505   CALCIUM 9.9 10/10/2016 1505   PROT 7.7 10/10/2016 1505   ALBUMIN 3.9 10/10/2016 1505   AST 45 (H) 10/10/2016 1505   ALT 69 (H) 10/10/2016 1505   ALKPHOS 88 10/10/2016 1505   BILITOT 0.30 10/10/2016 1505    INo results found for: SPEP, UPEP  Lab Results  Component Value Date   WBC 6.2 10/10/2016   NEUTROABS 3.7 10/10/2016   HGB 12.2 10/10/2016   HCT 37.6 10/10/2016   MCV 91.0 10/10/2016   PLT 409 (H) 10/10/2016      Chemistry      Component Value Date/Time   NA 139 10/10/2016 1505   K 4.0 10/10/2016 1505   CO2 29 10/10/2016 1505   BUN 8.0 10/10/2016 1505   CREATININE 0.8 10/10/2016 1505      Component Value Date/Time   CALCIUM 9.9 10/10/2016 1505   ALKPHOS 88 10/10/2016 1505   AST 45 (H) 10/10/2016 1505   ALT 69 (H) 10/10/2016 1505   BILITOT 0.30 10/10/2016 1505       No results found for: LABCA2  No components found for: LABCA125  No results for input(s): INR in the last 168 hours.  Urinalysis No results found for: COLORURINE, APPEARANCEUR, LABSPEC, PHURINE, GLUCOSEU, HGBUR, BILIRUBINUR, KETONESUR, PROTEINUR, UROBILINOGEN, NITRITE, LEUKOCYTESUR      STUDIES: US Transvaginal Non-ob  Result Date: 10/08/2016 CLINICAL DATA:  Vaginal left lower quadrant pain. History of hysterectomy. EXAM: TRANSABDOMINAL AND TRANSVAGINAL ULTRASOUND OF PELVIS TECHNIQUE: Both transabdominal and transvaginal ultrasound examinations of the pelvis were performed. Transabdominal technique was performed for global imaging of the pelvis including uterus, ovaries, adnexal regions, and pelvic cul-de-sac. It was necessary to proceed with endovaginal exam following the transabdominal exam to visualize the right ovary. COMPARISON:  None FINDINGS: Uterus Surgically absent  Endometrium Not applicable Right ovary Not visualized Left ovary Measurements: 3.8 x 2.4 x 2.5 cm with a 1.3 x 1.3 x 1.6 cm cyst without worrisome features. Other findings No abnormal free fluid. IMPRESSION: Hysterectomy.  Nonvisualized right ovary. 1.3 x 1.3 x 1.6 cm left ovarian cyst without worrisome features almost certainly to be benign. Electronically Signed   By: Ashley Royalty M.D.   On: 10/08/2016 14:57   US Pelvis Complete  Result Date: 10/08/2016 CLINICAL DATA:  Vaginal left lower quadrant pain. History of hysterectomy. EXAM: TRANSABDOMINAL  AND TRANSVAGINAL ULTRASOUND OF PELVIS TECHNIQUE: Both transabdominal and transvaginal ultrasound examinations of the pelvis were performed. Transabdominal technique was performed for global imaging of the pelvis including uterus, ovaries, adnexal regions, and pelvic cul-de-sac. It was necessary to proceed with endovaginal exam following the transabdominal exam to visualize the right ovary. COMPARISON:  None FINDINGS: Uterus Surgically absent Endometrium Not applicable Right ovary Not visualized Left ovary Measurements: 3.8 x 2.4 x 2.5 cm with a 1.3 x 1.3 x 1.6 cm cyst without worrisome features. Other findings No abnormal free fluid. IMPRESSION: Hysterectomy.  Nonvisualized right ovary. 1.3 x 1.3 x 1.6 cm left ovarian cyst without worrisome features almost certainly to be benign. Electronically Signed   By: Ashley Royalty M.D.   On: 10/08/2016 14:57    ELIGIBLE FOR AVAILABLE RESEARCH PROTOCOL: no  ASSESSMENT: 60 y.o. [place] woman status post left breast upper inner quadrant biopsy 04/21/2016 for a clinical T1b N0, stage IA invasive ductal carcinoma, strongly estrogen and progesterone receptor positive, HER-2 nonamplified, with an MIB-1 of 15%.   (1) status post left lumpectomy and sentinel lymph node sampling 05/01/2016 for a pT1c pN0, stage IA invasive ductal carcinoma, grade 1, with negative margins, and repeat HER-2 again negative  (2) Oncotype DX t score  of 10 predicts a 10 year risk of recurrence outside the breast of 7% if the patient's only systemic therapy is tamoxifen for 5 years. It also predicts no benefit from adjuvant chemotherapy.  (3) adjuvant radiation 05/29/2016-06/25/2016  1) Left breast: 40.05 in 15 fractions  2) Left breast boost: 10 Gy in 5 fractions  (4) started anastrozole 08/03/2016   (a) bone density 09/10/2016 is normal with a T score of -0.3 reasonably elderly I am enclosing a hospital yes denies  PLAN:  Chrislynn is tolerating anastrozole generally well and it is significant that despite her history of fibromyalgia she does not report increased aches or pains. The hot flashes are problem but hopefully they will "burned off" as she gets farther away from her recent discontinuation of estrogen replacement.  She is concerned about having an ovarian cyst. This however is clearly benign. She is not at increased risk for ovarian cancer and I discouraged her from undergoing bilateral salpingo-oophorectomy.  She will have mammography again in June. She will see me shortly after that. I will see her every 6 months for the initial year and a half and then yearly until she completes her 5 years of follow-up here  She knows to call for any problems that may develop before her next visit.  Chauncey Cruel, MD   10/10/2016 4:50 PM Medical Oncology and Hematology [facility] [address] [place], [place] [address] Tel. [phone]    Fax. [phone]

## 2016-12-09 ENCOUNTER — Ambulatory Visit: Payer: 59 | Admitting: Physical Therapy

## 2017-01-25 ENCOUNTER — Other Ambulatory Visit: Payer: Self-pay | Admitting: Oncology

## 2017-04-01 ENCOUNTER — Telehealth: Payer: Self-pay | Admitting: *Deleted

## 2017-04-01 ENCOUNTER — Other Ambulatory Visit: Payer: Self-pay | Admitting: *Deleted

## 2017-04-01 MED ORDER — CLONIDINE HCL 0.1 MG PO TABS: 0.1000 mg | ORAL_TABLET | Freq: Two times a day (BID) | ORAL | 3 refills | Status: DC | PRN

## 2017-04-21 ENCOUNTER — Other Ambulatory Visit: Payer: Self-pay | Admitting: Oncology

## 2017-04-21 DIAGNOSIS — Z853 Personal history of malignant neoplasm of breast: Secondary | ICD-10-CM

## 2017-04-23 ENCOUNTER — Ambulatory Visit: Payer: 59 | Admitting: Oncology

## 2017-04-23 ENCOUNTER — Other Ambulatory Visit: Payer: 59

## 2017-05-04 ENCOUNTER — Ambulatory Visit
Admission: RE | Admit: 2017-05-04 | Discharge: 2017-05-04 | Disposition: A | Discharge status: Home / Self Care | Payer: BLUE CROSS/BLUE SHIELD | Source: Ambulatory Visit | Attending: Oncology | Admitting: Oncology

## 2017-05-04 DIAGNOSIS — Z853 Personal history of malignant neoplasm of breast: Secondary | ICD-10-CM

## 2017-05-04 HISTORY — DX: Personal history of irradiation: Z92.3

## 2017-05-22 ENCOUNTER — Telehealth: Payer: Self-pay | Admitting: Oncology

## 2017-05-22 NOTE — Telephone Encounter (Signed)
lvm to inform pt of r/s 8/9 appt to 8/29 at 1030 per sch msg

## 2017-06-09 ENCOUNTER — Other Ambulatory Visit: Payer: Self-pay | Admitting: Oncology

## 2017-06-11 ENCOUNTER — Other Ambulatory Visit: Payer: 59

## 2017-06-11 ENCOUNTER — Ambulatory Visit: Payer: 59 | Admitting: Oncology

## 2017-07-01 ENCOUNTER — Ambulatory Visit: Payer: Self-pay | Admitting: Oncology

## 2017-07-01 ENCOUNTER — Other Ambulatory Visit: Payer: BLUE CROSS/BLUE SHIELD

## 2017-07-05 ENCOUNTER — Other Ambulatory Visit: Payer: Self-pay | Admitting: Oncology

## 2017-07-17 ENCOUNTER — Other Ambulatory Visit: Payer: Self-pay | Admitting: Oncology

## 2017-07-17 NOTE — Telephone Encounter (Signed)
Pt needs to be seen by physician before another script can be filled after this one.

## 2017-07-22 ENCOUNTER — Other Ambulatory Visit: Payer: Self-pay

## 2017-07-22 MED ORDER — GABAPENTIN 300 MG PO CAPS: ORAL_CAPSULE | ORAL | 6 refills | Status: DC

## 2017-07-23 ENCOUNTER — Telehealth: Payer: Self-pay

## 2017-07-23 NOTE — Telephone Encounter (Signed)
Pt called that her gabapentin was not refilled yet. Called pt back and lvm this rn will check with pharmacy.  Called [facility] and her gabapentin is ready for pick up.

## 2017-08-18 ENCOUNTER — Telehealth: Payer: Self-pay

## 2017-08-18 NOTE — Telephone Encounter (Signed)
Returned pt's call regarding concern over left breast. Pt states she was diagnosed with shingles on 10/14 and was put on prednisone and valtrex.  Pt states on 10/15 in AM left breast was red and warm in area of previous radiation treatments.  Pt completed radiation "1 yr ago I think".  Breast is not firm and no discharge.  Skin is dry and intact.  Pt states it looks slightly better today.   This RN advised pt could be r/t shingles.  Instructed pt to continue to monitor and call back if not improving, or worsening, by Thursday.

## 2017-08-21 NOTE — Telephone Encounter (Signed)
No entry 

## 2017-08-22 ENCOUNTER — Other Ambulatory Visit: Payer: Self-pay | Admitting: Oncology

## 2017-09-08 NOTE — Progress Notes (Signed)
[place]  [phone] [phone]     ID: Loretta Rocha DOB: [birth year]  MR#: [id number]  LYY#:[id number]  Patient Care Team: Jani Gravel, MD as PCP - General (Internal Medicine) Daniel Ritthaler, Virgie Dad, MD as Consulting Physician (Oncology) Fanny Skates, MD as Consulting Physician (General Surgery) Laurence Spates, MD as Consulting Physician (Gastroenterology) PCP: Jani Gravel, MD OTHER MD:  CHIEF COMPLAINT: Estrogen receptor positive breast cancer   CURRENT TREATMENT: Anastrozole  BREAST CANCER HISTORY: From the original intake note:  Loretta Rocha had routine screening mammography with tomography at the [facility] 04/16/2016. This found a possible distortion in the left breast. On 04/17/2016 she underwent diagnostic left mammography with tomography and ultrasonography. The breast density was category C. In the upper inner left breast there was a 0.7 cm obscured mass. A biopsy clip was in the inferior part of the breast. Physical exam found no palpable mass. Ultrasonography confirmed a 0.9 cm irregular echogenic mass at the 12:00 position 5 cm from the nipple, and a second mass at the 10:00 position 4 cm from the nipple measuring 0.7 cm. There was no left axillary adenopathy.  Biopsy of both masses was performed 04/21/2016. The more superior mass, measuring 0.9 cm on ultrasound, was invasive ductal carcinoma, grade 1, estrogen receptor 100% positive, progesterone receptor 100% positive, with strong staining intensity, with an MIB-1 of 15%, and no HER-2 amplification, the signals ratio being 1.79, and the number per cell 1.50 ([id number]). The second mass, at 10:00, showed fibrosis with no evidence of malignancy.  Her subsequent history is as detailed below  INTERVAL HISTORY: Loretta Rocha returns today for follow-up and treatment of her estrogen receptor positive breast cancer.  She continues on anastrozole, with good tolerance. She has some hot flashes which she  manages with gabapentin. She has some vaginal dryness, but it is not a Rocha issue. She used to take clonidine pills, but she had 2 fainting spells and discontinued the medication. She notes that while on the clonidine patch, she didn't have fainting spells, but is unable to obtain it due to insurance issues.   Since her last visit to the office, Loretta Rocha has undergone a bilateral diagnostic mammogram with CAD and tomography at [facility] on 05/04/2017 showing no mammographic evidence of malignancy in either breast.   REVIEW OF SYSTEMS: Loretta Rocha reports that she is well overall. She is working part-time now at a [employer]. She moved in with her parents, and they take care of each other. For exercise, she reports that she walked about 7,000 steps yesterday while at work. She and her daughter take short hikes occasionally for about 2 miles when she visits her in [place]. She denies unusual headaches, visual changes, nausea, vomiting, or dizziness. There has been no unusual cough, phlegm production, or pleurisy. This been no change in bowel or bladder habits. She denies unexplained fatigue or unexplained weight loss, bleeding, rash, or fever. A detailed review of systems was otherwise stable.    PAST MEDICAL HISTORY: Past Medical History:  Diagnosis Date  . 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 Breast cancer of upper-inner quadrant of left female breast ([place])   . Fibromyalgia   . History of radiation therapy 05/29/16- 06/25/2016   Left Breast  . IBS (irritable bowel syndrome)   . Personal history of radiation therapy     PAST SURGICAL HISTORY: Past Surgical History:  Procedure Laterality Date  . ABDOMINAL HYSTERECTOMY    . [place]  has Loretta Rocha reservoir placed after treatment for leukemia  . BREAST BIOPSY    . BREAST LUMPECTOMY     left 2017  . PARTIAL HYSTERECTOMY      FAMILY HISTORY No family history on file. The patient's father died at age 88 with  a history of lung cancer in the setting of tobacco abuse. The patient's mother is 73 years old as of June 2017. The patient has 2 brothers, no sisters. There is a history of prostate cancer on the maternal side. There is no history of breast or ovarian cancer in the family.  GYNECOLOGIC HISTORY:  No LMP recorded. Patient has had a hysterectomy. Menarche age 24, first live birth age 53. She is GX P2. The patient underwent simple hysterectomy without salpingo-oophorectomy for endometriosis in 1994. She has been on hormone replacement approximately 6 years, stopping in June 2017.  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 She is divorced and lives with her parents with her terrier-yorkie mix and a cat. Daughter Loretta Rocha lives in [place] where she works as a Education officer, museum. Daughter Loretta Rocha  lives in [place] where she works as a Education administrator, hoping to go to [employer] school. The patient has a step daughter from her earlier marriage Loretta Rocha, who lives in [place]. Loretta Rocha has one child and one on the way.     ADVANCED DIRECTIVES: Not in place   HEALTH MAINTENANCE: Social History   Tobacco Use  . Smoking status: Former Smoker    Last attempt to quit: 04/27/1993    Years since quitting: 24.3  . Smokeless tobacco: Never Used  Substance Use Topics  . Alcohol use: Yes    Alcohol/week: 1.2 oz    Types: 2 Standard drinks or equivalent per week    Comment: social  . Drug use: No     Colonoscopy: October 2016/  PAP: Status post hysterectomy  Bone density: 2012?  Lipid panel:  Allergies  Allergen Reactions  . Lyrica [Pregabalin] Swelling    Swelling to hands and feet  . Morphine And Related Nausea Only  . Chlorhexidine Gluconate Rash  . Penicillins Rash    Received 2 GM Ancef with no obvious reaction.     Current Outpatient  Medications  Medication Sig Dispense Refill  . ALPRAZolam (XANAX) 0.5 MG tablet Take 0.5 mg by mouth at bedtime as needed for anxiety (1/2 tab prn).    Marland Kitchen anastrozole (ARIMIDEX) 1 MG tablet TAKE ONE TABLET BY MOUTH DAILY 30 tablet 0  . b complex vitamins capsule Take 1 capsule by mouth daily.    . butalbital-acetaminophen-caffeine (FIORICET, ESGIC) 50-325-40 MG tablet Frequency:Every twelve hours   Dosage:0   MG  Instructions:  Note:TAKE 1 TABLET BY MOUTH EVERY 12 HOURS AS NEEDED FOR HEADACHE    . carisoprodol (SOMA) 350 MG tablet Take 350 mg by mouth 2 (two) times daily as needed.     . DULoxetine (CYMBALTA) 60 MG capsule Take 60 mg by mouth daily.     Marland Kitchen gabapentin (NEURONTIN) 300 MG capsule TAKE ONE CAPSULE BY MOUTH THREE TIMES A DAY AS NEEDED 90 capsule 6  . ibuprofen (ADVIL,MOTRIN) 200 MG tablet Take 200 mg by mouth every 6 (six) hours as needed.    . Multiple Vitamin (MULTIVITAMIN WITH MINERALS) TABS tablet Take 1 tablet by mouth daily.    . Red Yeast Rice Extract (RED YEAST RICE  PO) Take 1 tablet by mouth 2 (two) times daily.     . traMADol (ULTRAM) 50 MG tablet Take 50 mg by mouth every 6 (six) hours as needed.    . TURMERIC CURCUMIN PO Take 1 capsule by mouth daily.     Marland Kitchen VITAMIN D, CHOLECALCIFEROL, PO Take 5,000 Units by mouth.     No current facility-administered medications for this visit.     OBJECTIVE: Middle-aged white woman in no acute distress Vitals:   09/09/17 1100  BP: (!) 127/50  Pulse: 92  Resp: 18  Temp: 97.6 F (36.4 C)  SpO2: 99%     Body mass index is 23.14 kg/m.    ECOG FS:1 - Symptomatic but completely ambulatory  Sclerae unicteric, EOMs intact Oropharynx clear and moist No cervical or supraclavicular adenopathy Lungs no rales or rhonchi Heart regular rate and rhythm Abd soft, nontender, positive bowel sounds MSK no focal spinal tenderness, no upper extremity lymphedema Neuro: nonfocal, well oriented, appropriate affect Breasts: The right breast is  unremarkable.  The left breast is undergone lumpectomy followed by radiation, with no evidence of local recurrence.  Both axillae are benign. Skin: She has some resolving lesions in her right chin which she tells me are shingles (although they do appear to cross the midline).  LAB RESULTS:  CMP     Component Value Date/Time   NA 139 10/10/2016 1505   K 4.0 10/10/2016 1505   CO2 29 10/10/2016 1505   GLUCOSE 94 10/10/2016 1505   BUN 8.0 10/10/2016 1505   CREATININE 0.8 10/10/2016 1505   CALCIUM 9.9 10/10/2016 1505   PROT 7.7 10/10/2016 1505   ALBUMIN 3.9 10/10/2016 1505   AST 45 (H) 10/10/2016 1505   ALT 69 (H) 10/10/2016 1505   ALKPHOS 88 10/10/2016 1505   BILITOT 0.30 10/10/2016 1505    INo results found for: SPEP, UPEP  Lab Results  Component Value Date   WBC 4.8 09/09/2017   NEUTROABS 2.8 09/09/2017   HGB 11.9 09/09/2017   HCT 35.3 09/09/2017   MCV 91.6 09/09/2017   PLT 403 (H) 09/09/2017      Chemistry      Component Value Date/Time   NA 139 10/10/2016 1505   K 4.0 10/10/2016 1505   CO2 29 10/10/2016 1505   BUN 8.0 10/10/2016 1505   CREATININE 0.8 10/10/2016 1505      Component Value Date/Time   CALCIUM 9.9 10/10/2016 1505   ALKPHOS 88 10/10/2016 1505   AST 45 (H) 10/10/2016 1505   ALT 69 (H) 10/10/2016 1505   BILITOT 0.30 10/10/2016 1505       No results found for: LABCA2  No components found for: LABCA125  No results for input(s): INR in the last 168 hours.  Urinalysis No results found for: COLORURINE, APPEARANCEUR, LABSPEC, PHURINE, GLUCOSEU, HGBUR, BILIRUBINUR, KETONESUR, PROTEINUR, UROBILINOGEN, NITRITE, LEUKOCYTESUR   STUDIES: Since her last visit to the office, Markesha has undergone a bilateral diagnostic mammogram with CAD and tomography at [facility] on 05/04/2017 showing: breast density category C. New lumpectomy site left breast. There is no mammographic evidence of malignancy in either breast.  ELIGIBLE FOR AVAILABLE RESEARCH  PROTOCOL: no  ASSESSMENT: 61 y.o. [place] woman status post left breast upper inner quadrant biopsy 04/21/2016 for a clinical T1b N0, stage IA invasive ductal carcinoma, strongly estrogen and progesterone receptor positive, HER-2 nonamplified, with an MIB-1 of 15%.   (1) status post left lumpectomy and sentinel lymph node sampling 05/01/2016 for a pT1c  pN0, stage IA invasive ductal carcinoma, grade 1, with negative margins, and repeat HER-2 again negative  (2) Oncotype DX t score of 10 predicts a 10 year risk of recurrence outside the breast of 7% if the patient's only systemic therapy is tamoxifen for 5 years. It also predicts no benefit from adjuvant chemotherapy.  (3) adjuvant radiation 05/29/2016-06/25/2016  1) Left breast: 40.05 in 15 fractions  2) Left breast boost: 10 Gy in 5 fractions  (4) started anastrozole 08/03/2016   (a) bone density 09/10/2016 is normal with a T score of -0.3 reasonably elderly I am enclosing a hospital yes denies  PLAN:  Ifrah is now about a year and half out from definitive surgery for her breast cancer with no evidence of disease recurrence.  This is favorable.  She is tolerating anastrozole generally well except for the hot flashes.  She is already taking gabapentin 3 times a day.  She was doing well with the TTS-1 patches, but her insurance refused to pay for them.  Just in case things have changed I have gone ahead and placed that order again for her  The plan of course is to continue anastrozole for a total of 5 years.  She will be due for a repeat bone density scan in early November 2019.  Accordingly I will see her late November 2019 so we can discuss those results.  She knows to call for any other issues that may develop before her next visit.  Margan Elias, Virgie Dad, MD  09/09/17 11:24 AM Medical Oncology and Hematology [facility] [address] [place], [place] [address] Tel. [phone]    Fax. [phone]  This document  serves as a record of services personally performed by Lurline Del, MD. It was created on his behalf by Sheron Nightingale, a trained medical scribe. The creation of this record is based on the scribe's personal observations and the provider's statements to them.   I have reviewed the above documentation for accuracy and completeness, and I agree with the above.

## 2017-09-09 ENCOUNTER — Ambulatory Visit (HOSPITAL_BASED_OUTPATIENT_CLINIC_OR_DEPARTMENT_OTHER): Payer: BLUE CROSS/BLUE SHIELD | Admitting: Oncology

## 2017-09-09 ENCOUNTER — Other Ambulatory Visit (HOSPITAL_BASED_OUTPATIENT_CLINIC_OR_DEPARTMENT_OTHER): Payer: BLUE CROSS/BLUE SHIELD

## 2017-09-09 VITALS — BP 127/50 | HR 92 | Temp 97.6°F | Resp 18 | Ht 62.0 in | Wt 126.5 lb

## 2017-09-09 DIAGNOSIS — C50212 Malignant neoplasm of upper-inner quadrant of left female breast: Secondary | ICD-10-CM

## 2017-09-09 DIAGNOSIS — Z79811 Long term (current) use of aromatase inhibitors: Secondary | ICD-10-CM

## 2017-09-09 DIAGNOSIS — Z170 Estrogen receptor positive status [ER+]: Secondary | ICD-10-CM

## 2017-09-09 DIAGNOSIS — N951 Menopausal and female climacteric states: Secondary | ICD-10-CM | POA: Diagnosis not present

## 2017-09-09 LAB — COMPREHENSIVE METABOLIC PANEL
ALK PHOS: 73 U/L (ref 40–150)
ALT: 24 U/L (ref 0–55)
ANION GAP: 9 meq/L (ref 3–11)
AST: 22 U/L (ref 5–34)
Albumin: 3.8 g/dL (ref 3.5–5.0)
BUN: 11.7 mg/dL (ref 7.0–26.0)
CALCIUM: 10.1 mg/dL (ref 8.4–10.4)
CO2: 29 mEq/L (ref 22–29)
CREATININE: 0.8 mg/dL (ref 0.6–1.1)
Chloride: 103 mEq/L (ref 98–109)
EGFR: >60 mL/min/{1.73_m2} (ref >60)
Glucose: 148 mg/dl — ABNORMAL HIGH (ref 70–140)
POTASSIUM: 4.2 meq/L (ref 3.5–5.1)
Sodium: 142 mEq/L (ref 136–145)
Total Bilirubin: 0.3 mg/dL (ref 0.20–1.20)
Total Protein: 7.3 g/dL (ref 6.4–8.3)

## 2017-09-09 LAB — CBC WITH DIFFERENTIAL/PLATELET
BASO%: 1.1 % (ref 0.0–2.0)
Basophils Absolute: 0.1 10*3/uL (ref 0.0–0.1)
EOS ABS: 0.1 10*3/uL (ref 0.0–0.5)
EOS%: 1.5 % (ref 0.0–7.0)
HCT: 35.3 % (ref 34.8–46.6)
HEMOGLOBIN: 11.9 g/dL (ref 11.6–15.9)
LYMPH%: 27.6 % (ref 14.0–49.7)
MCH: 31 pg (ref 25.1–34.0)
MCHC: 33.8 g/dL (ref 31.5–36.0)
MCV: 91.6 fL (ref 79.5–101.0)
MONO#: 0.6 10*3/uL (ref 0.1–0.9)
MONO%: 11.7 % (ref 0.0–14.0)
NEUT%: 58.1 % (ref 38.4–76.8)
NEUTROS ABS: 2.8 10*3/uL (ref 1.5–6.5)
Platelets: 403 10*3/uL — ABNORMAL HIGH (ref 145–400)
RBC: 3.86 10*6/uL (ref 3.70–5.45)
RDW: 13.7 % (ref 11.2–14.5)
WBC: 4.8 10*3/uL (ref 3.9–10.3)
lymph#: 1.3 10*3/uL (ref 0.9–3.3)

## 2017-09-09 MED ORDER — ANASTROZOLE 1 MG PO TABS: 1.0000 mg | ORAL_TABLET | Freq: Every day | ORAL | 4 refills | Status: DC

## 2017-09-09 MED ORDER — CLONIDINE 0.1 MG/24HR TD PTWK: 0.1000 mg | MEDICATED_PATCH | TRANSDERMAL | 12 refills | Status: AC

## 2017-09-09 MED ORDER — GABAPENTIN 300 MG PO CAPS: ORAL_CAPSULE | ORAL | 6 refills | Status: DC

## 2017-09-14 ENCOUNTER — Telehealth: Payer: Self-pay | Admitting: Oncology

## 2017-09-14 ENCOUNTER — Other Ambulatory Visit: Payer: Self-pay | Admitting: Oncology

## 2017-09-14 DIAGNOSIS — M8580 Other specified disorders of bone density and structure, unspecified site: Secondary | ICD-10-CM

## 2017-09-14 NOTE — Telephone Encounter (Signed)
Spoke with patient re lab/fu and bone density test November 2019.

## 2017-12-03 ENCOUNTER — Telehealth: Payer: Self-pay | Admitting: *Deleted

## 2017-12-03 MED ORDER — GABAPENTIN 100 MG PO CAPS: ORAL_CAPSULE | ORAL | 2 refills | Status: DC

## 2017-12-03 NOTE — Telephone Encounter (Signed)
This RN spoke with pt per her call stating she is having increased hot flashes during the day with some noted dizziness.  Per discussion she states " my hot flashes seem worse when I am at work - and then I have lost 10 lbs and do not know if that has affected the anastrozole or the gabapentin "  Loretta Rocha inquired " can I take the anastrozole every other day and decrease the gabapentin to 150 mg tid "  This RN discussed increased hot flashes during the day at work may be influenced by stress and activity resulting in increase vasoconstriction. Reviewed mechanism triggering hot flashes.  Medically we can not recommend anastrozole every other day with a known outcome and if she proceeds with this dosing we need to see her for any additional follow up needed.  Also discussed gabapentin dosing including dose dose not come in 150 mg but 100 mg capsules which she may titrate appropriately. Her current dose is 300 mg tid.  Plan is Loretta Rocha will initiate titration with gabapentin- she is undecided about proceeding with anastrozole at every other day dose, so she will call this RN in 2 weeks with update post above for further communication and possible need for visit prior to scheduled followup.

## 2018-04-07 ENCOUNTER — Other Ambulatory Visit: Payer: Self-pay | Admitting: Oncology

## 2018-05-03 ENCOUNTER — Telehealth: Payer: Self-pay | Admitting: *Deleted

## 2018-05-03 ENCOUNTER — Encounter: Payer: Self-pay | Admitting: Oncology

## 2018-05-03 NOTE — Telephone Encounter (Signed)
VM received from the patient stating she is needing to obtain an appointment for visit and she is due for a bone density.  Loretta Rocha states she is currently un insured and is inquiring of :  Cost of MD visit with Dr [name] -  Any resources that would assist her with needed bone density study.  She will be having her mammogram under the [facility] program.  Need for bone density is related to her treatment for post menopausal female with history of ER/PR positive breast cancer on aromatase inhibitor.  This note will be sent to managed care for appropriate resources and further contact with the patient.  This RN called pt and informed her of above.

## 2018-05-03 NOTE — Progress Notes (Signed)
Returned patient's call regarding financial concerns for next office visit for uninsured.  Advised patient of the estimate provided to [place] last year based on office outpatient visit level 5. Advised patient this is just an estimate. Also explained the new facility charge that gets billed as well which went into effect Jan 7,2019 and that I do not have a cost for this charge. Advised all uninsured patients receive a 55% discount for being uninsured on services billed through [facility] and if she would like to apply for additional assistance there is a financial assistance application that she can complete and submit along with supporting documents to determine eligibility. Patient asked about cost of bone density test that she needs. Advised patient she would need to contact the facility where the test will be performed to obtain this information. Patient verbalized understanding and thanked me for my help. Advised patient if she does not have the money for the office visit at the time of service, she may request to be billed. She thanked me for my time.

## 2018-05-21 ENCOUNTER — Other Ambulatory Visit (HOSPITAL_COMMUNITY): Payer: Self-pay | Admitting: *Deleted

## 2018-05-21 DIAGNOSIS — Z853 Personal history of malignant neoplasm of breast: Secondary | ICD-10-CM

## 2018-05-24 IMAGING — MG NEEDLE LOCALIZATION OF THE LEFT BREAST WITH MAMMO GUIDANCE
8 series · 8 of 8 positions shown · non-contrast
Comparison: [DATE] and earlier

CLINICAL DATA: Patient presents for radioactive seed localization
prior to lumpectomy for left breast cancer.

EXAM:
MAMMOGRAPHIC GUIDED RADIOACTIVE SEED LOCALIZATION OF THE LEFT BREAST

[L CC (1 of 3)]
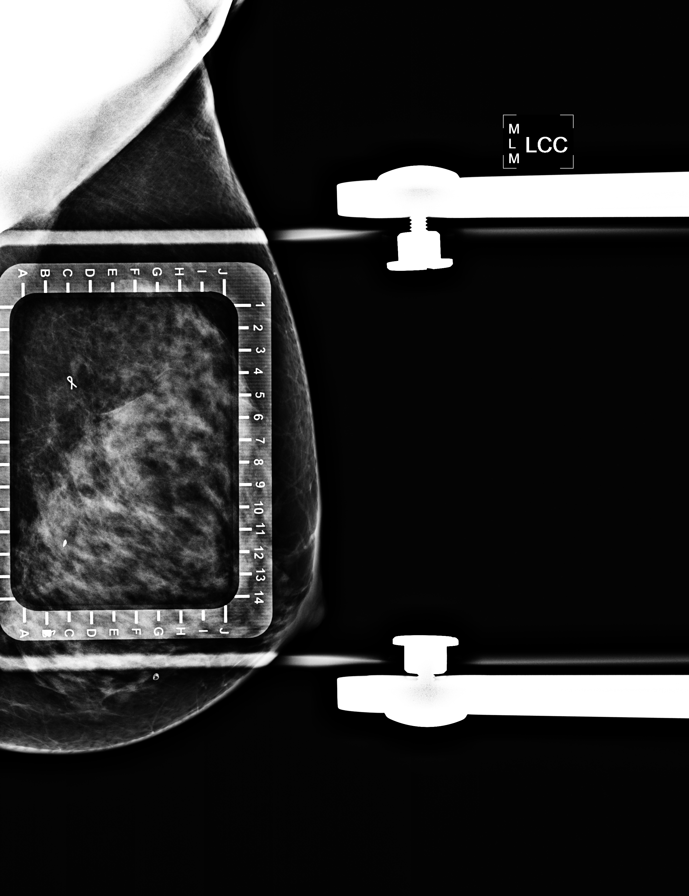

[L CC (2 of 3)]
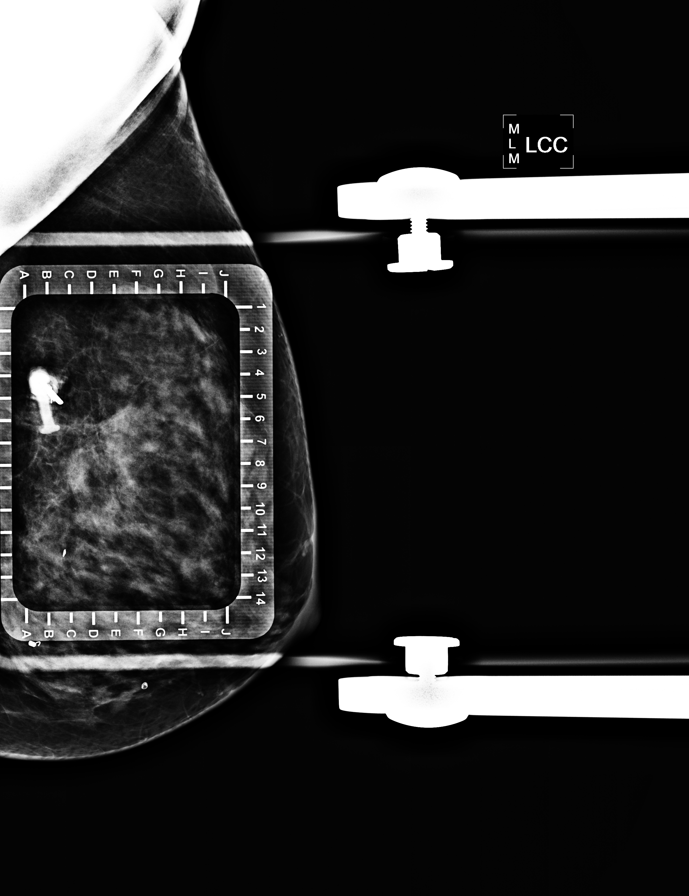

[L ML (1 of 5)]
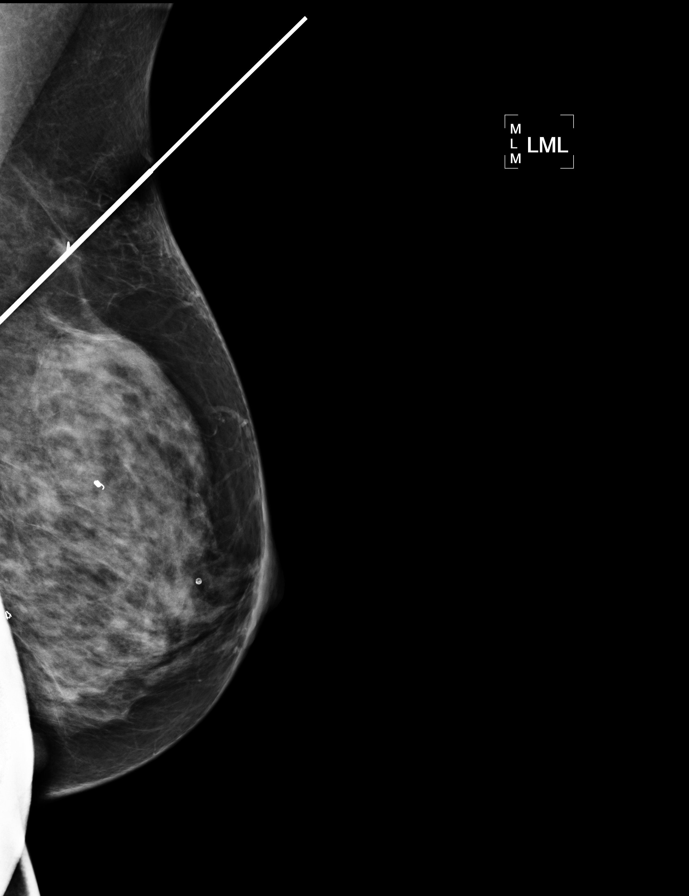

[L ML (2 of 5)]
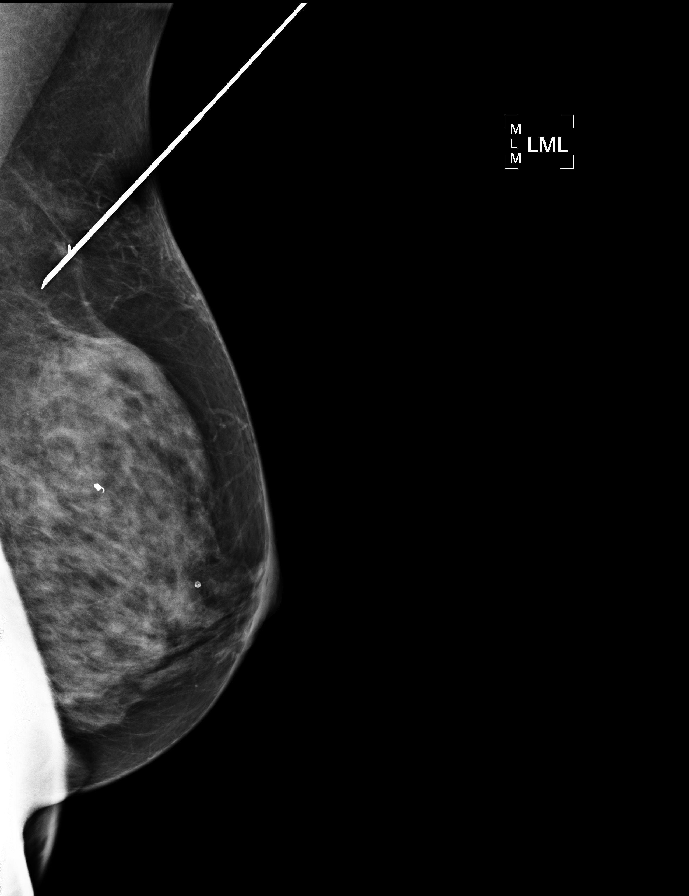

[L ML (3 of 5)]
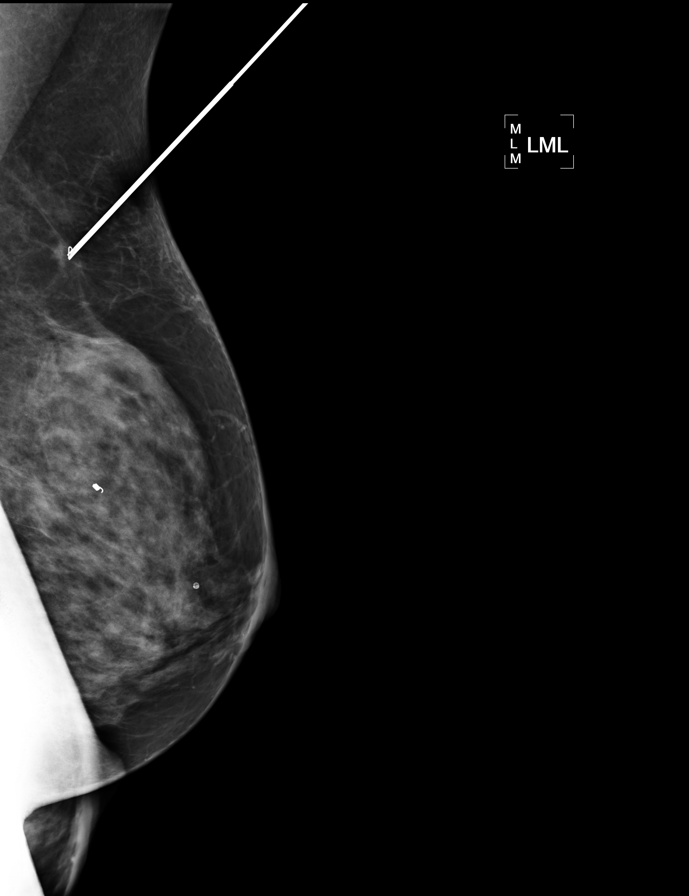

[L ML (4 of 5)]
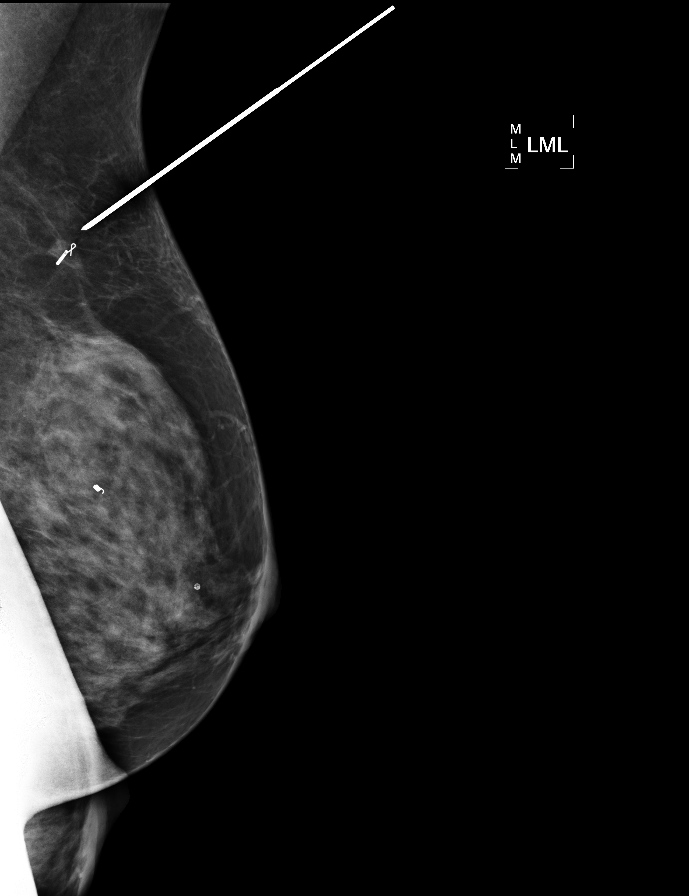

[L ML (5 of 5)]
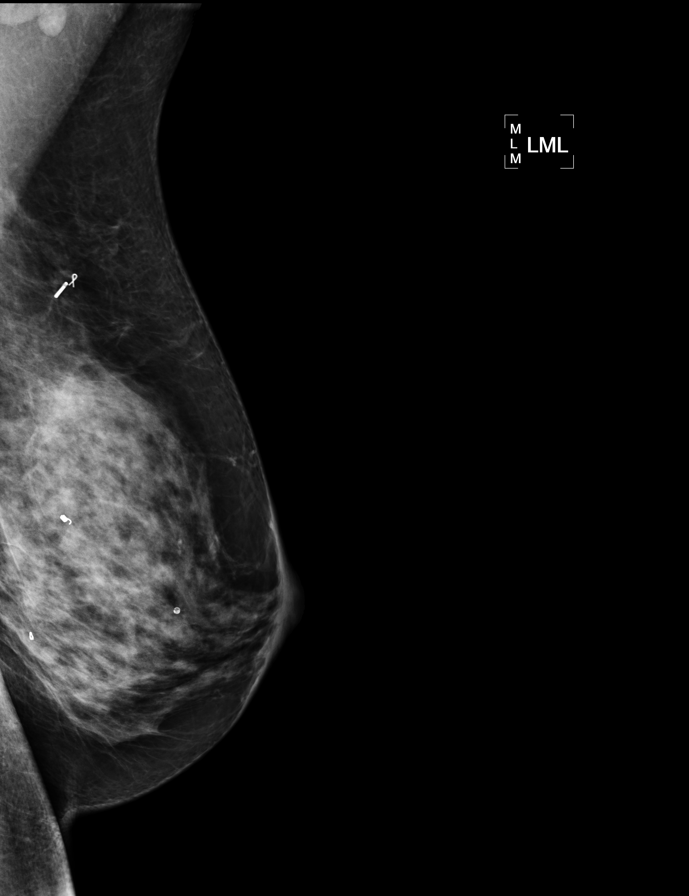

[L CC (3 of 3)]
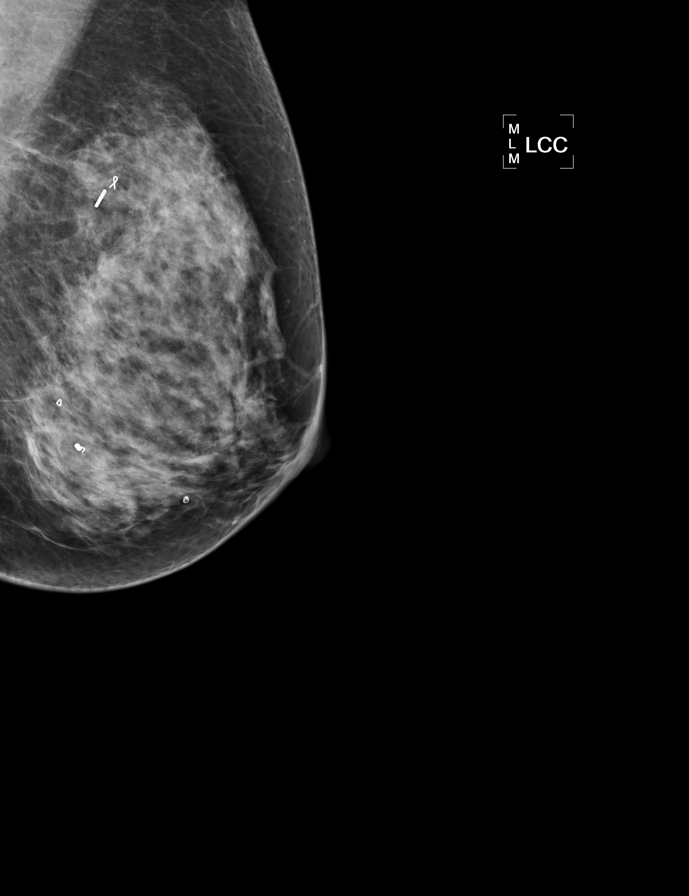

[8 of 8 positions shown; findings below may reference images not displayed]

FINDINGS: Patient presents for radioactive seed localization prior to
lumpectomy. I met with the patient and we discussed the procedure of
seed localization including benefits and alternatives. We discussed
the high likelihood of a successful procedure. We discussed the
risks of the procedure including infection, bleeding, tissue injury
and further surgery. We discussed the low dose of radioactivity
involved in the procedure. Informed, written consent was given.

The usual time-out protocol was performed immediately prior to the
procedure.

Using mammographic guidance, sterile technique, 1% lidocaine and an
O-LUJ radioactive seed, ribbon shaped clip in the upper-outer
quadrant of the left breast was localized using a cephalad approach.
The follow-up mammogram images confirm the seed in the expected
location and were marked for Dr. Teezy.

Follow-up survey of the patient confirms presence of the radioactive
seed.

Order number of O-LUJ seed:  [id number] to.

Total activity:  0.250 millicuries  Reference Date: 04/18/2016

The patient tolerated the procedure well and was released from the
[REDACTED]. She was given instructions regarding seed removal.
IMPRESSION: Radioactive seed localization left breast. No apparent
complications.

## 2018-05-25 IMAGING — MG BREAST SURGICAL SPECIMEN
1 series · 1 of 1 positions shown · non-contrast
Comparison: Previous exam(s).

CLINICAL DATA: Status post excision of a left breast carcinoma
following radioactive seed localization.

EXAM:
SPECIMEN RADIOGRAPH OF THE LEFT BREAST

[L]
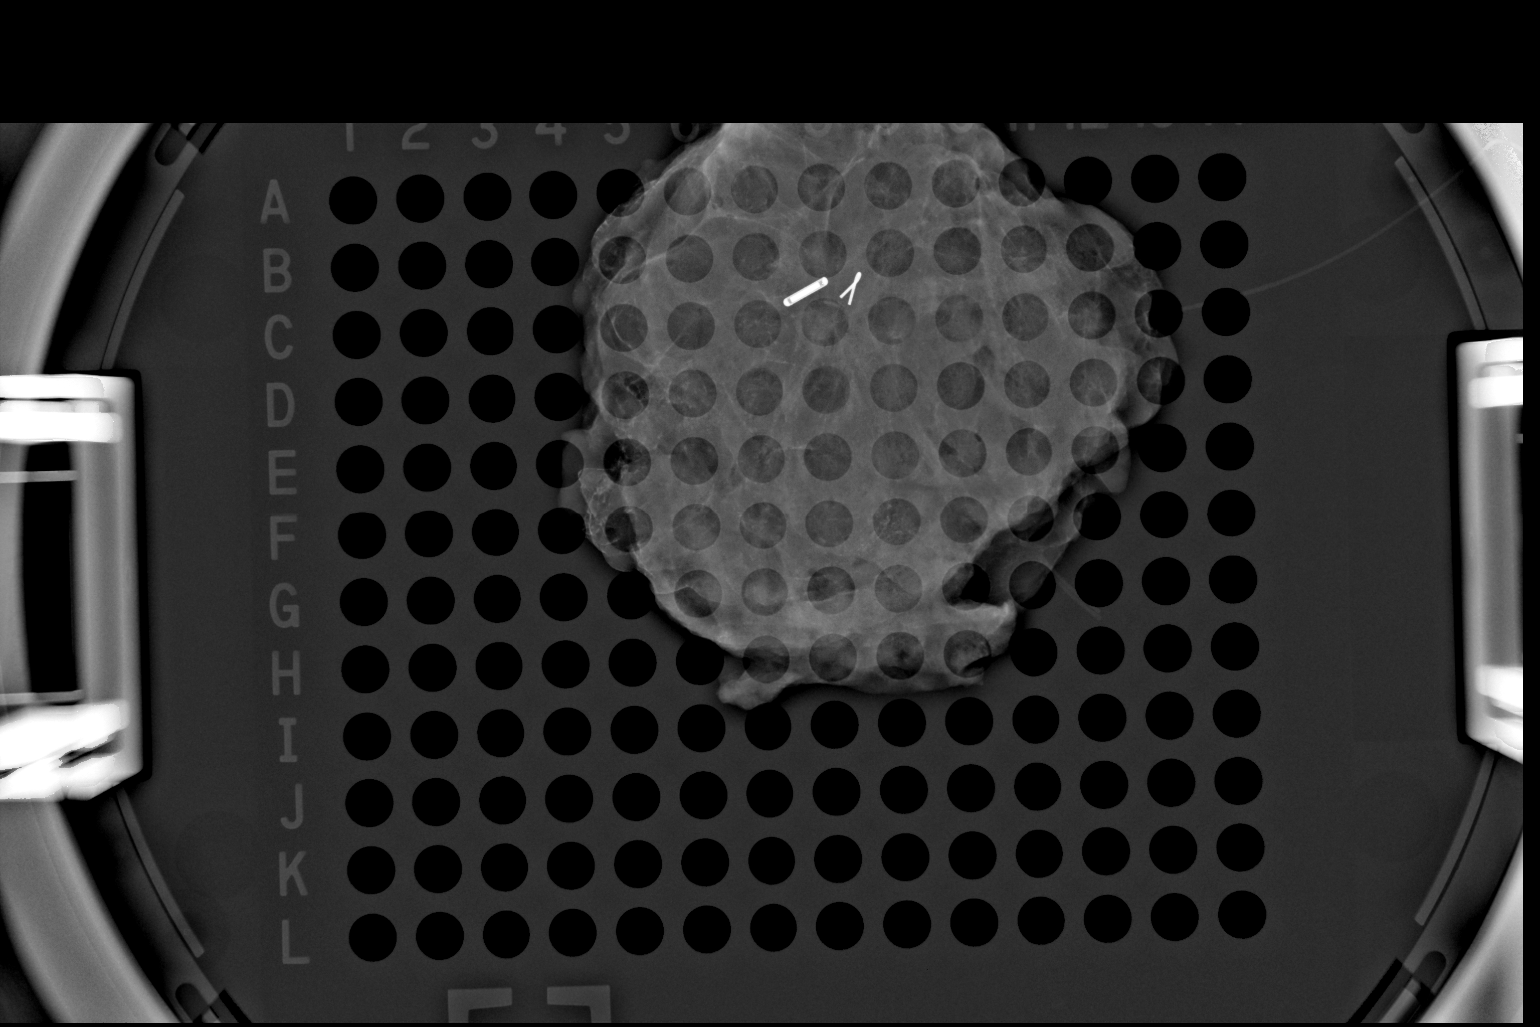

[1 of 1 positions shown; findings below may reference images not displayed]

FINDINGS: Status post excision of the left breast. The radioactive seed and
biopsy marker clip are present, completely intact, and were marked
for pathology.
IMPRESSION: Specimen radiograph of the left breast.

## 2018-06-10 ENCOUNTER — Encounter (HOSPITAL_COMMUNITY): Payer: Self-pay

## 2018-06-10 ENCOUNTER — Ambulatory Visit
Admission: RE | Admit: 2018-06-10 | Discharge: 2018-06-10 | Disposition: A | Discharge status: Home / Self Care | Payer: Self-pay | Source: Ambulatory Visit | Attending: Obstetrics and Gynecology | Admitting: Obstetrics and Gynecology

## 2018-06-10 ENCOUNTER — Ambulatory Visit (HOSPITAL_COMMUNITY)
Admission: RE | Admit: 2018-06-10 | Discharge: 2018-06-10 | Disposition: A | Discharge status: Home / Self Care | Payer: Self-pay | Source: Ambulatory Visit | Attending: Obstetrics and Gynecology | Admitting: Obstetrics and Gynecology

## 2018-06-10 ENCOUNTER — Other Ambulatory Visit (HOSPITAL_COMMUNITY): Payer: Self-pay | Admitting: Obstetrics and Gynecology

## 2018-06-10 ENCOUNTER — Encounter: Payer: Self-pay | Admitting: Oncology

## 2018-06-10 VITALS — BP 122/74 | Ht 62.0 in

## 2018-06-10 DIAGNOSIS — Z1239 Encounter for other screening for malignant neoplasm of breast: Secondary | ICD-10-CM

## 2018-06-10 DIAGNOSIS — Z853 Personal history of malignant neoplasm of breast: Secondary | ICD-10-CM

## 2018-06-10 HISTORY — DX: Acute lymphoblastic leukemia not having achieved remission: C91.00

## 2018-06-10 HISTORY — DX: Celiac disease: K90.0

## 2018-06-10 NOTE — Progress Notes (Signed)
Received referral from [employer] regarding financial concerns for uninsured patient. Called patient to introduce myself and she was waiting for mammogram. Gave her my contact information to reach out when she is available to discuss. She was very Patent attorney.

## 2018-06-10 NOTE — Patient Instructions (Signed)
Explained breast self awareness with [name]. Patient did not need a Pap smear today due to patient has a history of a hysterectomy for benign reasons. Let patient know that she doesn't need any further Pap smears due to her history of a hysterectomy for benign reasons. Referred patient to the [facility] for a diagnostic mammogram per recommendation due to her history of breast cancer and a lumpectomy 05/31/2016. Appointment scheduled for Thursday, June 10, 2018 at 1520. [name] verbalized understanding.  [name], Arvil Chaco, RN 2:16 PM

## 2018-06-10 NOTE — Progress Notes (Signed)
Complaints of left breast pain at surgical site where had breast lumpectomy 05/31/2016. Patient stated the pain has been there since the surgery. Patient states the pain comes and goes. Patient rates the pain at a 4-5 out of 10.  Pap Smear: Pap smear not completed today. Last Pap smear was 4 years ago in [place] and normal per patient. Per patient has no history of an abnormal Pap smear. No Pap smear results are in Epic. Patient has a history of a hysterectomy in 1992 due to AUB and endometriosis. Patient no longer needs Pap smears due to her history of a hysterectomy for benign reasons per BCCCP and ACOG guidelines. No Pap smear results are in Epic.  Physical exam: Breasts Breasts symmetrical. No skin abnormalities bilateral breasts. No nipple retraction bilateral breasts. No nipple discharge bilateral breasts. No lymphadenopathy. No lumps palpated bilateral breasts. No complaints of pain or tenderness on exam. Referred patient to the [facility] for a diagnostic mammogram per recommendation due to her history of breast cancer and a lumpectomy 05/31/2016. Appointment scheduled for Thursday, June 10, 2018 at 1520.        Pelvic/Bimanual No Pap smear completed today since patient has a history of a hysterectomy for benign reasons. Pap smear not indicated per BCCCP guidelines.   Smoking History: Patient is a former smoker that quit 04/27/1993.  Patient Navigation: Patient education provided. Access to services provided for patient through [facility] program.   Colorectal Cancer Screening: Per patient had a colonoscopy completed 3 years ago. No complaints today. FIT Test given to patient to complete and return to BCCCP.  Breast and Cervical Cancer Risk Assessment: Patient has no family history of breast cancer, known genetic mutations, or radiation treatment to the chest before age 82. Patient has no history of cervical dysplasia, immunocompromised, or DES exposure in-utero.  Risk  Assessment    Risk Scores      06/10/2018   Last edited by: Armond Hang, LPN   5-year risk: 2.3 %   Lifetime risk: 10.6 %

## 2018-06-11 ENCOUNTER — Encounter (HOSPITAL_COMMUNITY): Payer: Self-pay | Admitting: *Deleted

## 2018-06-15 ENCOUNTER — Other Ambulatory Visit (HOSPITAL_COMMUNITY): Payer: Self-pay | Admitting: Obstetrics and Gynecology

## 2018-06-15 ENCOUNTER — Ambulatory Visit
Admission: RE | Admit: 2018-06-15 | Discharge: 2018-06-15 | Disposition: A | Discharge status: Home / Self Care | Payer: Self-pay | Source: Ambulatory Visit | Attending: Obstetrics and Gynecology | Admitting: Obstetrics and Gynecology

## 2018-06-15 ENCOUNTER — Ambulatory Visit
Admission: RE | Admit: 2018-06-15 | Discharge: 2018-06-15 | Disposition: A | Discharge status: Home / Self Care | Payer: PRIVATE HEALTH INSURANCE | Source: Ambulatory Visit | Attending: Obstetrics and Gynecology | Admitting: Obstetrics and Gynecology

## 2018-06-15 DIAGNOSIS — Z853 Personal history of malignant neoplasm of breast: Secondary | ICD-10-CM

## 2018-06-17 ENCOUNTER — Encounter: Payer: Self-pay | Admitting: Oncology

## 2018-06-17 NOTE — Progress Notes (Signed)
Patient called to advise she only has to have the bone density test and wants to know if there is any assistance she can apply for. Advised patient all uninsured patients automatically receive a 55% discount and she may apply for additional assistance once she has a balance. She asked if application was online. I advised her that it is. Advised her that she may have the services done through [facility] and discount will be applied and once she has a balance may apply for additional assistance. She verbalized understanding and states she may get her test done elsewhere. She has my card for any additional financial questions or concerns.

## 2018-06-18 ENCOUNTER — Encounter: Payer: Self-pay | Admitting: Oncology

## 2018-07-07 ENCOUNTER — Other Ambulatory Visit (HOSPITAL_COMMUNITY): Payer: Self-pay | Admitting: *Deleted

## 2018-07-07 DIAGNOSIS — Z0000 Encounter for general adult medical examination without abnormal findings: Secondary | ICD-10-CM

## 2018-07-08 NOTE — Addendum Note (Signed)
Addended by: Unice Bailey B on: 07/08/2018 11:47 AM   Modules accepted: Orders

## 2018-07-09 ENCOUNTER — Inpatient Hospital Stay: Payer: PRIVATE HEALTH INSURANCE | Attending: Obstetrics and Gynecology | Admitting: *Deleted

## 2018-07-09 ENCOUNTER — Inpatient Hospital Stay: Payer: PRIVATE HEALTH INSURANCE

## 2018-07-09 VITALS — BP 108/60 | Ht 62.0 in | Wt 126.0 lb

## 2018-07-09 DIAGNOSIS — Z0000 Encounter for general adult medical examination without abnormal findings: Secondary | ICD-10-CM

## 2018-07-09 LAB — LIPID PANEL
Cholesterol: 267 mg/dL — ABNORMAL HIGH (ref 0–200)
HDL: 59 mg/dL (ref >40)
LDL Cholesterol: 196 mg/dL — ABNORMAL HIGH (ref 0–99)
TRIGLYCERIDES: 62 mg/dL (ref <150)
Total CHOL/HDL Ratio: 4.5 RATIO
VLDL: 12 mg/dL (ref 0–40)

## 2018-07-09 LAB — HEMOGLOBIN A1C
HEMOGLOBIN A1C: 6.1 % — AB (ref 4.8–5.6)
Mean Plasma Glucose: 128.37 mg/dL

## 2018-07-09 NOTE — Progress Notes (Signed)
[facility] initial screening  Clinical Measurement:  Height:  62in.  Weight:  126lb. Blood Pressure: 110/72   Blood Pressure #2: 108/60 Fasting Labs Drawn Today, will review with patient when they result.  Medical History:  Patient states that she has been diagnosed with high cholesterol.  Patient states she has not been diagnosed with high blood pressure, diabetes or heart disease.  Medications:  Patients states she is not taking any medications for high cholesterol, high blood pressure or diabetes.  She is not taking aspirin daily to prevent heart attack or stroke.    Blood pressure, self measurement:  Patients states she does not measure blood pressure at home.    Nutrition:  Patient states she eats 2 cups of fruit and 3 cups of vegetables in an average day.  Patient states she does  eat fish regularly, she eats more than half a serving of whole grains daily. She drinks m 36 ounces of beverages with added sugar weekly.  She is currently watching her sodium intake.  She has not had any drinks containing alcohol in the last seven days.   Physical activity:  Patient states that she gets 210 minutes of moderate exercise in a week.  She gets 140 minutes of vigorous exercise per week.     Smoking status:  Patient states she has never smoked and is not around any smokers.    Quality of life:  Patient states that she has had 0 bad physical days out of the last 30 days. In the last 2 weeks, she has had a couple of  days that she has felt down or depressed. She has had 0 days in the last 2 weeks that she has had little interest or pleasure in doing things.  Risk reduction and counseling:  Patient states she wants to increase fruit and vegetable intake.  I encouraged her to continue with current exercise regimen and increase vegetable and fruit intake.  Navigation:  I will notify patient of lab results.  Patient is aware of 2 more health coaching sessions and a follow up.

## 2018-07-12 ENCOUNTER — Telehealth (HOSPITAL_COMMUNITY): Payer: Self-pay | Admitting: *Deleted

## 2018-07-12 NOTE — Telephone Encounter (Signed)
Lab- cholesterol 267, LDL cholesterol 196, triglycerides 62, HDL cholesterol 59, hemoglobin A1C 6.1, mean plasma glucose 128.37   Patient is aware and understands these lab results.  Goals- Patient states that she will increase her exercise regimen by an extra 20 - 30 minutes daily. She said she will make adjustments in her diet to lower her cholesterol.  I encouraged her to consult with her physician before starting any supplements such as red yeast rice.  Navigation:   Patient is aware of 1 more health coaching sessions and a follow up.  Patient is under the care of Dr. Jani Gravel with [facility].   Time- 10 minutes

## 2018-08-02 ENCOUNTER — Other Ambulatory Visit: Payer: Self-pay | Admitting: *Deleted

## 2018-08-02 MED ORDER — GABAPENTIN 100 MG PO CAPS: ORAL_CAPSULE | ORAL | 2 refills | Status: AC

## 2018-09-13 ENCOUNTER — Other Ambulatory Visit: Payer: BLUE CROSS/BLUE SHIELD

## 2018-09-23 ENCOUNTER — Other Ambulatory Visit: Payer: Self-pay | Admitting: Oncology

## 2018-09-24 ENCOUNTER — Other Ambulatory Visit: Payer: Self-pay | Admitting: *Deleted

## 2018-09-24 MED ORDER — ANASTROZOLE 1 MG PO TABS: 1.0000 mg | ORAL_TABLET | Freq: Every day | ORAL | 1 refills | Status: AC

## 2018-09-27 ENCOUNTER — Inpatient Hospital Stay: Payer: PRIVATE HEALTH INSURANCE

## 2018-09-27 ENCOUNTER — Telehealth: Payer: Self-pay | Admitting: Oncology

## 2018-09-27 ENCOUNTER — Inpatient Hospital Stay: Payer: PRIVATE HEALTH INSURANCE | Admitting: Oncology

## 2018-09-27 NOTE — Telephone Encounter (Signed)
Patient called to cancel °

## 2018-09-27 NOTE — Telephone Encounter (Signed)
Called patient and left message for patient to call back per 11/22 sch message to set up follow up appt.

## 2018-10-04 ENCOUNTER — Telehealth: Payer: Self-pay

## 2018-11-16 ENCOUNTER — Other Ambulatory Visit: Payer: Self-pay | Admitting: Oncology

## 2018-11-16 DIAGNOSIS — R921 Mammographic calcification found on diagnostic imaging of breast: Secondary | ICD-10-CM

## 2018-12-13 ENCOUNTER — Other Ambulatory Visit: Payer: Self-pay | Admitting: Oncology

## 2018-12-13 ENCOUNTER — Ambulatory Visit
Admission: RE | Admit: 2018-12-13 | Discharge: 2018-12-13 | Disposition: A | Discharge status: Home / Self Care | Payer: Self-pay | Source: Ambulatory Visit | Attending: Oncology | Admitting: Oncology

## 2018-12-13 DIAGNOSIS — R921 Mammographic calcification found on diagnostic imaging of breast: Secondary | ICD-10-CM

## 2018-12-13 DIAGNOSIS — Z853 Personal history of malignant neoplasm of breast: Secondary | ICD-10-CM

## 2019-02-07 ENCOUNTER — Other Ambulatory Visit: Payer: Self-pay | Admitting: Oncology

## 2023-09-23 NOTE — Telephone Encounter (Signed)
Error
